# Patient Record
Sex: Male | Born: 1980
Health system: Southern US, Community
[De-identification: ages and names within clinical notes are randomized; demographics above are authoritative.]

## PROBLEM LIST (undated history)

## (undated) DIAGNOSIS — T7840XA Allergy, unspecified, initial encounter: Secondary | ICD-10-CM

## (undated) DIAGNOSIS — K760 Fatty (change of) liver, not elsewhere classified: Secondary | ICD-10-CM

## (undated) DIAGNOSIS — S0990XA Unspecified injury of head, initial encounter: Secondary | ICD-10-CM

## (undated) HISTORY — DX: Fatty (change of) liver, not elsewhere classified: K76.0

## (undated) HISTORY — DX: Unspecified injury of head, initial encounter: S09.90XA

## (undated) HISTORY — DX: Allergy, unspecified, initial encounter: T78.40XA

---

## 2000-05-04 DIAGNOSIS — S0990XA Unspecified injury of head, initial encounter: Secondary | ICD-10-CM

## 2000-05-04 HISTORY — DX: Unspecified injury of head, initial encounter: S09.90XA

## 2000-05-04 HISTORY — PX: EYE SURGERY: SHX253

## 2001-02-09 ENCOUNTER — Encounter: Payer: Self-pay | Admitting: Emergency Medicine

## 2001-02-10 ENCOUNTER — Encounter: Payer: Self-pay | Admitting: Emergency Medicine

## 2001-02-10 ENCOUNTER — Encounter: Payer: Self-pay | Admitting: General Surgery

## 2001-02-10 ENCOUNTER — Encounter: Payer: Self-pay | Admitting: Neurosurgery

## 2001-02-10 ENCOUNTER — Inpatient Hospital Stay (HOSPITAL_COMMUNITY): Admission: AC | Admit: 2001-02-10 | Discharge: 2001-02-26 | Payer: Self-pay

## 2001-02-11 ENCOUNTER — Encounter: Payer: Self-pay | Admitting: Neurosurgery

## 2001-02-11 ENCOUNTER — Encounter: Payer: Self-pay | Admitting: General Surgery

## 2001-02-12 ENCOUNTER — Encounter: Payer: Self-pay | Admitting: General Surgery

## 2001-02-12 ENCOUNTER — Encounter: Payer: Self-pay | Admitting: Neurosurgery

## 2001-02-14 ENCOUNTER — Encounter: Payer: Self-pay | Admitting: General Surgery

## 2001-02-15 ENCOUNTER — Encounter: Payer: Self-pay | Admitting: General Surgery

## 2001-02-16 ENCOUNTER — Encounter: Payer: Self-pay | Admitting: General Surgery

## 2001-02-18 ENCOUNTER — Encounter: Payer: Self-pay | Admitting: General Surgery

## 2001-02-20 ENCOUNTER — Encounter: Payer: Self-pay | Admitting: General Surgery

## 2001-02-21 ENCOUNTER — Encounter: Payer: Self-pay | Admitting: General Surgery

## 2001-02-23 ENCOUNTER — Encounter: Payer: Self-pay | Admitting: General Surgery

## 2001-02-25 ENCOUNTER — Encounter: Payer: Self-pay | Admitting: General Surgery

## 2001-03-11 ENCOUNTER — Encounter: Payer: Self-pay | Admitting: Specialist

## 2001-03-11 ENCOUNTER — Ambulatory Visit (HOSPITAL_COMMUNITY): Admission: RE | Admit: 2001-03-11 | Discharge: 2001-03-12 | Payer: Self-pay | Admitting: Specialist

## 2002-05-06 IMAGING — XA IR ANGIO/VERTEBRAL*L*
1 series · 13 of 24 positions shown · non-contrast
Comparison: none

FINDINGS
CLINICAL DATA: FELL FROM [place].  EXTENSIVE BASILAR SKULL FRACTURES.  ASSESS FOR VASCULAR INJURY.
CEREBRAL ARTERIOGRAM
SINGLE WALL PUNCTURE WAS PERFORMED OF THE RIGHT COMMON FEMORAL ARTERY.  A 5.5 FRENCH FIERSTEIN
CATHETER WAS USED FOR THE EXAMINATION.  THE RIGHT COMMON CAROTID ARTERY, RIGHT VERTEBRAL ARTERY,
LEFT COMMON CAROTID ARTERY AND LEFT VERTEBRAL ARTERY WERE CATHETERIZED SELECTIVELY.
RIGHT COMMON CAROTID ARTERIOGRAM
THE VESSELS ARE NORMAL.  NO EVIDENCE OF INJURY AT THE SKULL BASE.  INTRACRANIAL CIRCULATION NORMAL.
RIGHT VERTEBRAL ARTERY ARTERIOGRAM
THE VESSEL IS WIDELY PATENT WITH NO EVIDENCE OF INJURY AT THE SKULL BASE.  INTRACRANIAL CIRCULATION
NORMAL.
LEFT COMMON CAROTID ARTERY ARTERIOGRAM
THE VESSELS APPEAR  NORMAL WITHOUT EVIDENCE OF INJURY AT THE SKULL BASE.  THE INTRACRANIAL
CIRCULATION IS NORMAL.
LEFT VERTEBRAL ARTERY ARTERIOGRAM
THE VESSEL IS WIDELY PATENT INTO THE BRAIN WITHOUT EVIDENCE OF INJURY AT THE SKULL BASE.  NO
INTRACRANIAL ABNORMALITY.
IMPRESSION
NEGATIVE EXAMINATION.  NO EVIDENCE OF VESSEL INJURY AT THE SKULL BASE.

[Series 1: run · 13 of 76 slices shown]
[im 1/76]
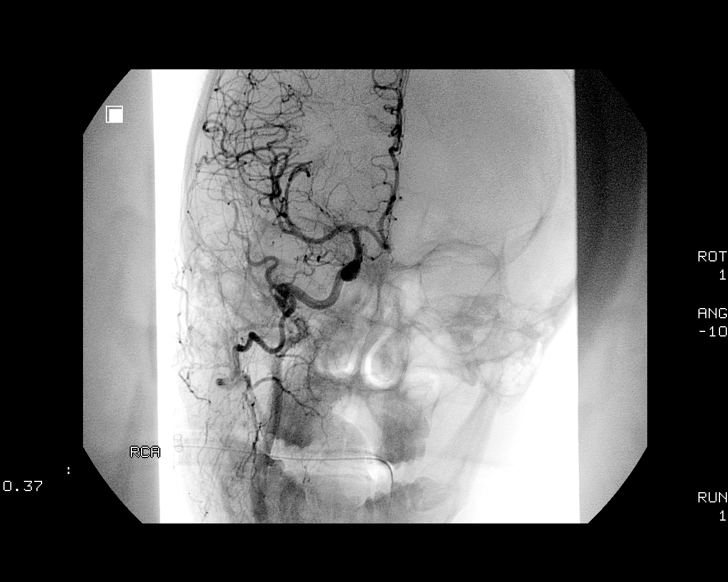
[im 7/76]
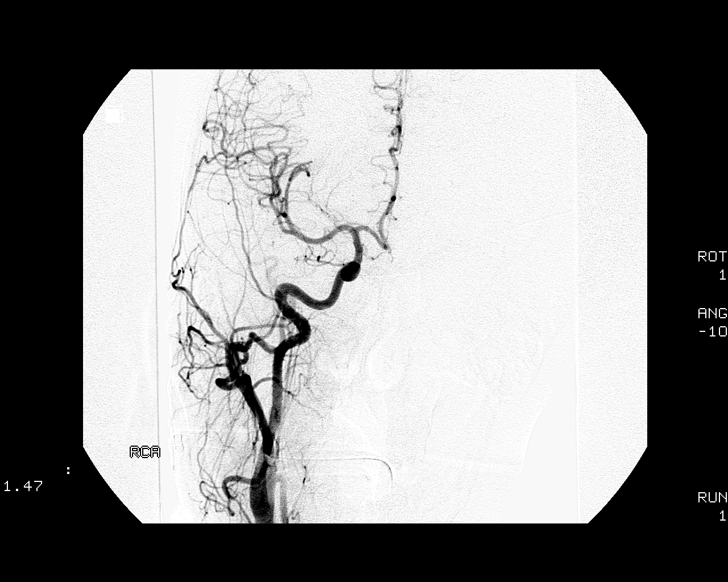
[im 14/76]
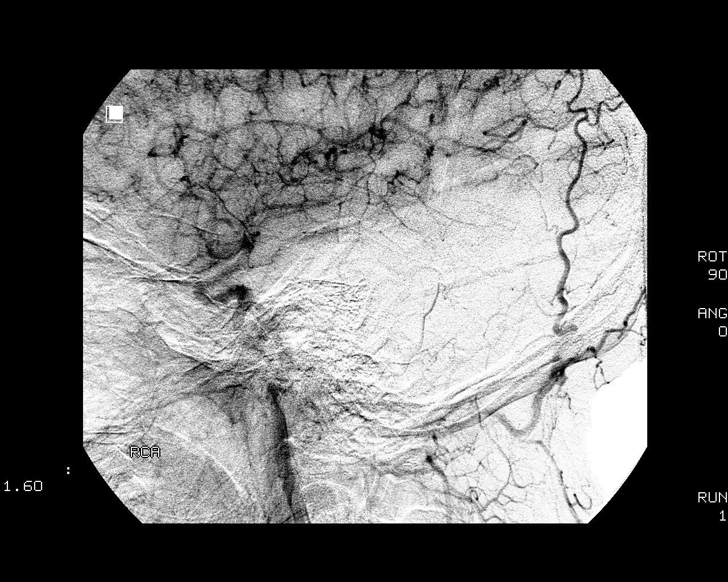
[im 20/76]
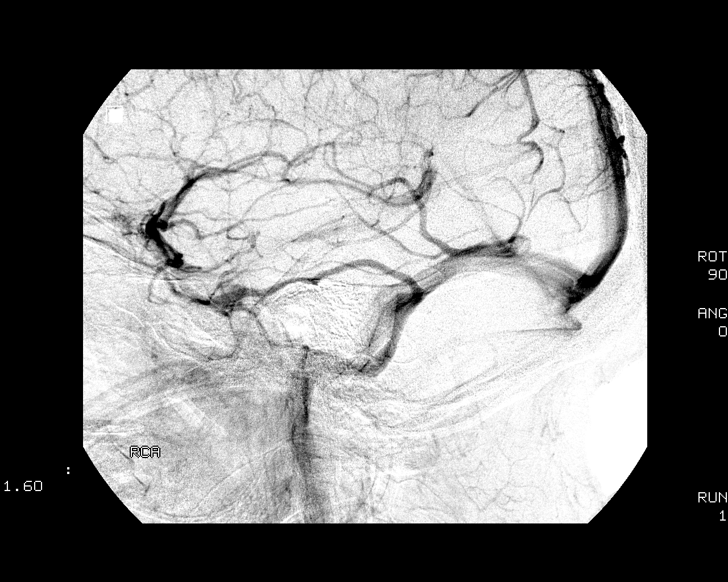
[im 27/76]
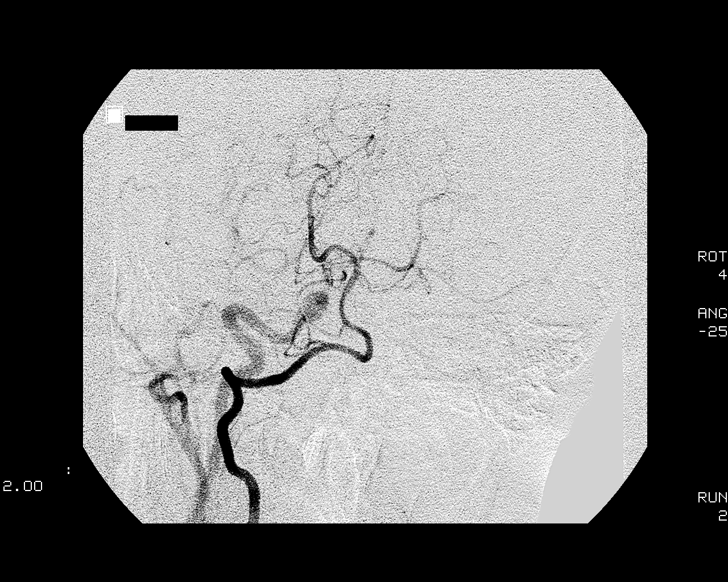
[im 33/76]
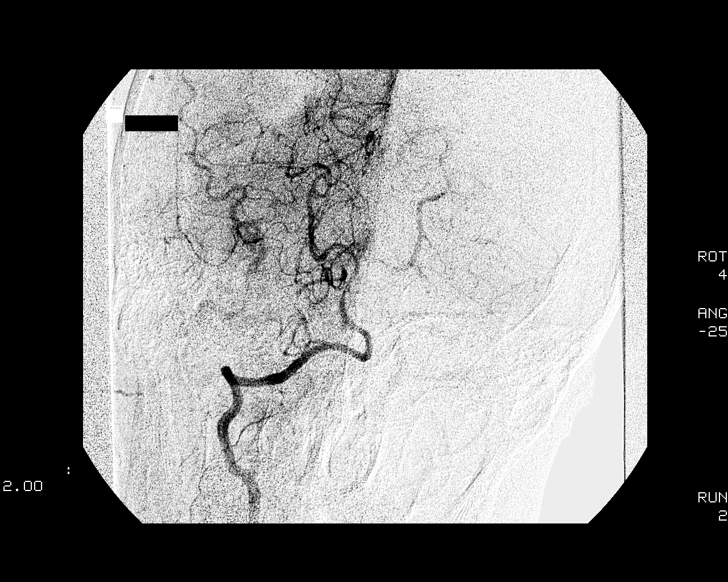
[im 40/76]
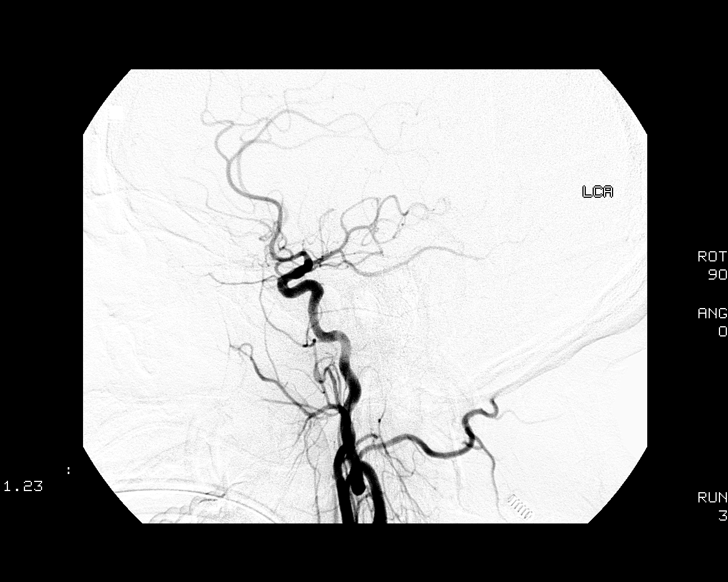
[im 43/76]
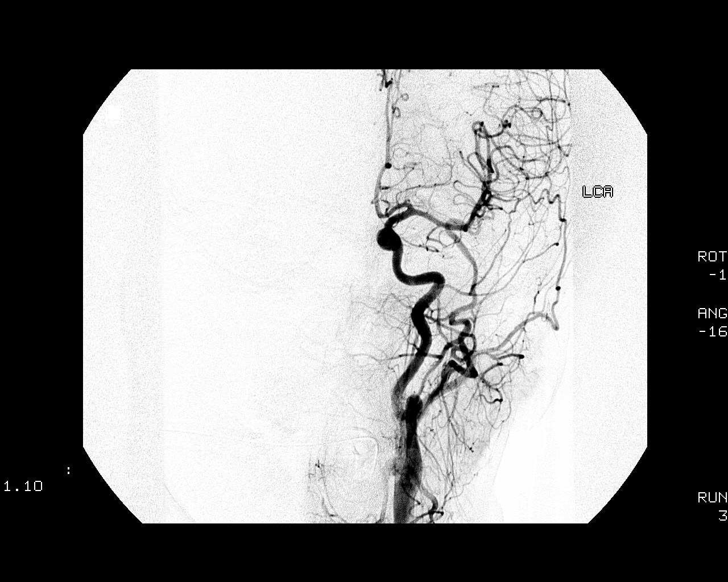
[im 49/76]
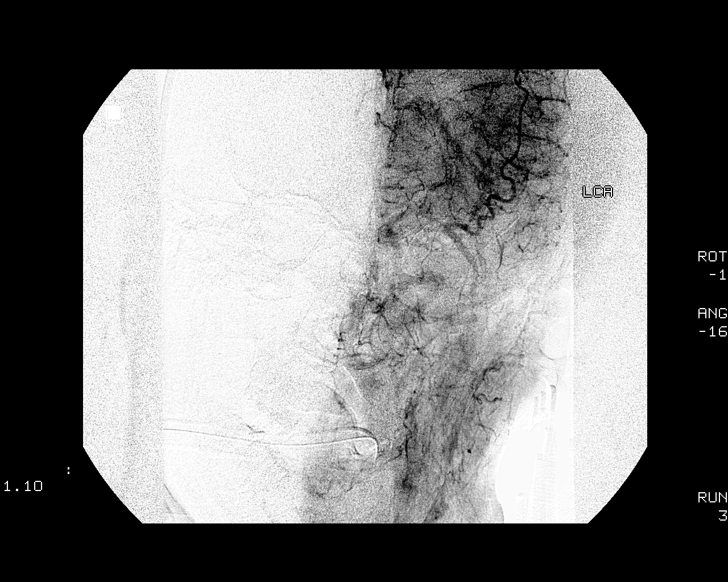
[im 56/76]
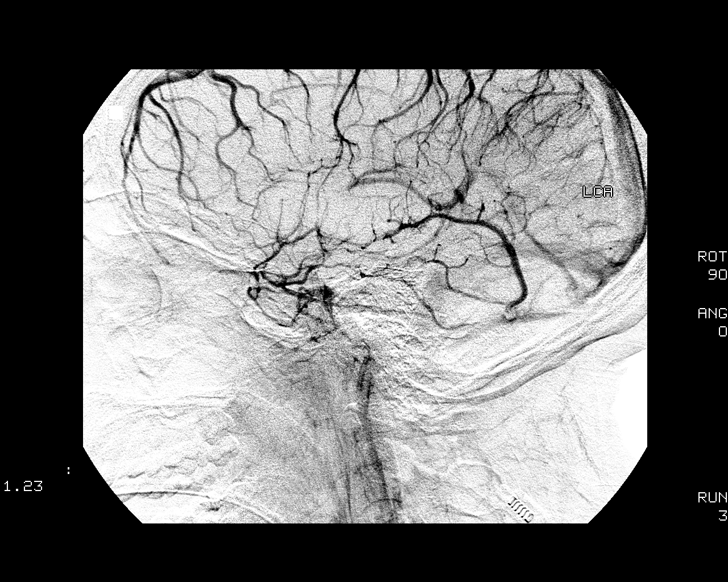
[im 62/76]
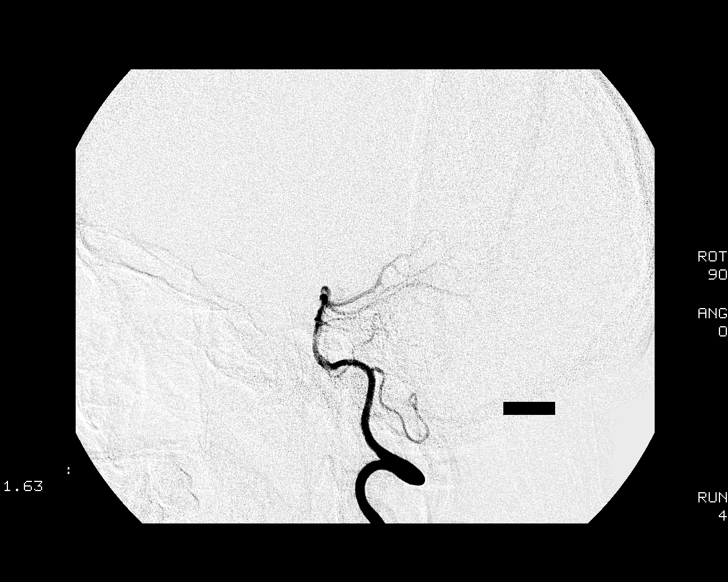
[im 69/76]
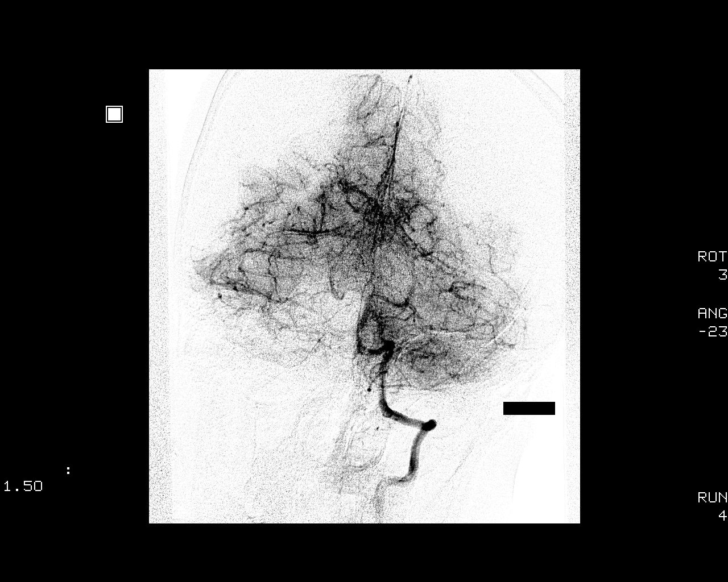
[im 76/76]
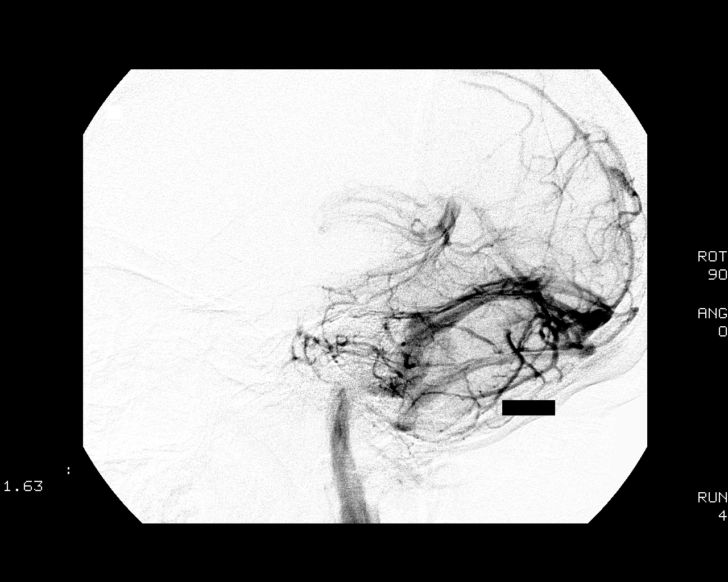

[13 of 24 positions shown; findings below may reference images not displayed]

## 2010-01-09 ENCOUNTER — Ambulatory Visit: Payer: Self-pay | Admitting: Internal Medicine

## 2010-01-09 DIAGNOSIS — K219 Gastro-esophageal reflux disease without esophagitis: Secondary | ICD-10-CM

## 2010-01-09 DIAGNOSIS — L230 Allergic contact dermatitis due to metals: Secondary | ICD-10-CM

## 2010-01-09 DIAGNOSIS — J45909 Unspecified asthma, uncomplicated: Secondary | ICD-10-CM | POA: Insufficient documentation

## 2010-01-11 ENCOUNTER — Encounter: Payer: Self-pay | Admitting: Internal Medicine

## 2010-02-04 ENCOUNTER — Ambulatory Visit: Payer: Self-pay | Admitting: Internal Medicine

## 2010-02-04 ENCOUNTER — Telehealth: Payer: Self-pay | Admitting: Internal Medicine

## 2010-02-06 ENCOUNTER — Encounter: Payer: Self-pay | Admitting: Internal Medicine

## 2010-03-18 ENCOUNTER — Ambulatory Visit: Payer: Self-pay | Admitting: Internal Medicine

## 2010-03-18 DIAGNOSIS — F430 Acute stress reaction: Secondary | ICD-10-CM | POA: Insufficient documentation

## 2010-03-21 ENCOUNTER — Telehealth: Payer: Self-pay | Admitting: Internal Medicine

## 2010-03-21 ENCOUNTER — Encounter: Payer: Self-pay | Admitting: Internal Medicine

## 2010-04-02 ENCOUNTER — Telehealth: Payer: Self-pay | Admitting: Family

## 2010-04-04 ENCOUNTER — Ambulatory Visit: Payer: Self-pay | Admitting: Internal Medicine

## 2010-04-11 ENCOUNTER — Ambulatory Visit: Payer: Self-pay | Admitting: Licensed Clinical Social Worker

## 2010-04-17 ENCOUNTER — Ambulatory Visit: Payer: Self-pay | Admitting: Internal Medicine

## 2010-04-18 ENCOUNTER — Ambulatory Visit: Payer: Self-pay | Admitting: Licensed Clinical Social Worker

## 2010-05-23 ENCOUNTER — Ambulatory Visit
Admission: RE | Admit: 2010-05-23 | Discharge: 2010-05-23 | Payer: Self-pay | Source: Home / Self Care | Attending: Licensed Clinical Social Worker | Admitting: Licensed Clinical Social Worker

## 2010-06-02 ENCOUNTER — Ambulatory Visit (INDEPENDENT_AMBULATORY_CARE_PROVIDER_SITE_OTHER)
Admission: RE | Admit: 2010-06-02 | Discharge: 2010-06-02 | Payer: BC Managed Care – PPO | Source: Home / Self Care | Attending: Internal Medicine | Admitting: Internal Medicine

## 2010-06-02 ENCOUNTER — Encounter: Payer: Self-pay | Admitting: Internal Medicine

## 2010-06-02 DIAGNOSIS — F411 Generalized anxiety disorder: Secondary | ICD-10-CM | POA: Insufficient documentation

## 2010-06-02 DIAGNOSIS — F430 Acute stress reaction: Secondary | ICD-10-CM

## 2010-06-02 LAB — CONVERTED CEMR LAB
ALT: 21 units/L (ref 0–53)
AST: 17 units/L (ref 0–37)
Albumin: 4.6 g/dL (ref 3.5–5.2)
Alkaline Phosphatase: 59 units/L (ref 39–117)
Bilirubin, Direct: 0.1 mg/dL (ref 0.0–0.3)
FSH: 3.5 milliintl units/mL (ref 1.4–18.1)
Free T4: 1.13 ng/dL (ref 0.80–1.80)
HCT: 45.8 % (ref 39.0–52.0)
Hemoglobin: 15.2 g/dL (ref 13.0–17.0)
Indirect Bilirubin: 0.3 mg/dL (ref 0.0–0.9)
LH: 5.9 milliintl units/mL (ref 1.5–9.3)
MCHC: 33.2 g/dL (ref 30.0–36.0)
MCV: 91.6 fL (ref 78.0–100.0)
Platelets: 246 10*3/uL (ref 150–400)
RBC: 5 M/uL (ref 4.22–5.81)
RDW: 12.9 % (ref 11.5–15.5)
TSH: 1.842 microintl units/mL (ref 0.350–4.500)
Total Bilirubin: 0.4 mg/dL (ref 0.3–1.2)
Total Protein: 7.3 g/dL (ref 6.0–8.3)
WBC: 6.6 10*3/uL (ref 4.0–10.5)

## 2010-06-03 NOTE — Letter (Signed)
Summary: Out of Work  Adult nurse at [employer]. [address]   [place], [place] [address]   Phone: [phone]  Fax: [phone]      March 21, 2010     Employee:  Abdulkadir Landfair    To Whom It May Concern:   For Medical reasons, please excuse the above named employee from work for the following dates:  Start:   03-18-10  End:   03-31-10   Pt may return to work on 04/01/10. If you need additional information, please feel free to contact our office.   Sincerely,    Mervin Kung CMA (AAMA) Thomos Lemons, DO

## 2010-06-03 NOTE — Letter (Signed)
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 If you need additional information, please feel free to contact our office.          Sincerely,    Glendell Docker CMA Dr Thomos Lemons

## 2010-06-03 NOTE — Assessment & Plan Note (Signed)
Summary: PPD Read  Nurse Visit   Primary Care Provider:  Dondra Spry DO  CC:  TB Skin test recheck.  History of Present Illness: The patient presented after 48 hours to check the site for positive or negative reaction.  Injection site examination: No firm bump  forms at the test site. Slightly reddish appearance and diameter was smaller than 5mm.  Assessment & Plan: Negative TB Skin test. Patient was counseled to call if exeprience any irritation of site.   Glendell Docker CMA  February 06, 2010 4:54 PM   CC: TB Skin test recheck   Allergies: No Known Drug Allergies  PPD Results    Date of reading: 02/06/2010    Results: < 5mm    Interpretation: negative

## 2010-06-03 NOTE — Progress Notes (Signed)
Summary: FMLA   Phone Note Other Incoming   Summary of Call: Received FMLA papers from [employer].  Spoke to pt., he states he is unable to return to work and is requesting completion of FMLA forms.  Advised pt he would need to re-evaluated. Appt scheduled for 04/04/10 @ 11am.  Mervin Kung CMA Duncan Dull)  April 02, 2010 4:23 PM

## 2010-06-03 NOTE — Assessment & Plan Note (Signed)
Summary: new pt est care/dt   Vital Signs:  Patient profile:   30 year old male Height:      69.5 inches Weight:      201 pounds BMI:     29.36 O2 Sat:      97 % on Room air Temp:     98.5 degrees F oral Pulse rate:   70 / minute Pulse rhythm:   regular Resp:     20 per minute BP sitting:   130 / 70  (left arm) Cuff size:   large  Vitals Entered By: Glendell Docker CMA (January 09, 2010 9:34 AM)  O2 Flow:  Room air CC: New Patient  Is Patient Diabetic? No Pain Assessment Patient in pain? no      Comments establish care, physical - non fasting, no previous pcp   Primary Care Provider:  Dondra Spry DO  CC:  New Patient .  History of Present Illness: 30 y/o AA male to establish and for routine cpx hx of severe head injury - fell 2 stories no headaches no hx of seizure d/o [employer]  wife and pt have been trying to have baby x 1 yr    Preventive Screening-Counseling & Management  Alcohol-Tobacco     Alcohol drinks/day: <1     Smoking Status: quit     Year Started: 2004     Year Quit: 2008     Cigars/week: 2 per week  Caffeine-Diet-Exercise     Caffeine use/day: 1 beverage daily     Does Patient Exercise: yes     Times/week: 3  Allergies (verified): No Known Drug Allergies  Past History:  Past Medical History: Asthma GERD  2002 - Hx of severe closed head injury -  fell off balcony landing on his head ( Dr. Shon Hough )   right supraorbital fx  right elbow fracture  right maxillary fracture  subdural hematoma  subarachnoid hemorrhage    Past Surgical History: Right eye socket repair due to accident-had a plate in place 1610   Family History: Family History Diabetes 1st degree relative Family History Hypertension    Social History: Occupation - works at [employer] Married 1 year  no children 2 foster children  GM - Chief of Staff ScottSmoking Status:  quit Caffeine use/day:  1 beverage daily Does Patient Exercise:  yes  Review of  Systems  The patient denies anorexia, fever, weight loss, weight gain, vision loss, chest pain, dyspnea on exertion, abdominal pain, melena, hematochezia, severe indigestion/heartburn, and depression.    Physical Exam  General:  alert, well-developed, and well-nourished.   Neck:  No deformities, masses, or tenderness noted. Lungs:  normal respiratory effort, normal breath sounds, no crackles, and no wheezes.   Heart:  normal rate, regular rhythm, no murmur, and no gallop.   Abdomen:  soft, non-tender, normal bowel sounds, no hepatomegaly, and no splenomegaly.   Extremities:  No lower extremity edema Neurologic:  cranial nerves II-XII intact and gait normal.   Skin:  eczematous patch lower abd near belt line Psych:  normally interactive, good eye contact, not anxious appearing, and not depressed appearing.     Impression & Recommendations:  Problem # 1:  PHYSICAL EXAMINATION (ICD-V70.0) Reviewed adult health maintenance protocols. Pt counseled on diet and exercise.  Orders: T-Basic Metabolic Panel [phone]) T-Lipid Profile [id number])  Td Booster: Historical (12/25/2008)   Flu Vax: Declined (01/09/2010)     Problem # 2:  CONTACT DERMATITIS DUE TO METALS (OZH-086.57) pt with  rash near where belt buckle contacts his skin.  we discussed avoidance measures.  use triamcinolone as directed  His updated medication list for this problem includes:    Loratadine 10 Mg Tabs (Loratadine) .Marland Kitchen... Take 1 tablet by mouth once a day    Triamcinolone Acetonide 0.1 % Crea (Triamcinolone acetonide) .Marland Kitchen... Apply two times a day  Complete Medication List: 1)  Loratadine 10 Mg Tabs (Loratadine) .... Take 1 tablet by mouth once a day 2)  Multivitamins Tabs (Multiple vitamin) .... Take 1 tablet by mouth once a day 3)  Fertility Blend For Men  .... Take 1 tablet by mouth once a day 4)  Triamcinolone Acetonide 0.1 % Crea (Triamcinolone acetonide) .... Apply two times a day  Patient  Instructions: 1)  Please schedule a follow-up appointment in 1 year. 2)  Please schedule a follow-up appointment as needed. 3)  Goal wt - 170 - 175 lbs Prescriptions: TRIAMCINOLONE ACETONIDE 0.1 % CREA (TRIAMCINOLONE ACETONIDE) apply two times a day  #15 grams x 1   Entered and Authorized by:   D. Thomos Lemons DO   Signed by:   D. Thomos Lemons DO on 01/09/2010   Method used:   Electronically to        [facility] (retail)       [address]       [place], [place]  [address]       Ph: [phone]       Fax: [phone]   RxID:   [id number]   Current Allergies (reviewed today): No known allergies    Preventive Care Screening  Last Tetanus Booster:    Date:  12/25/2008    Results:  Historical     Immunization History:  Influenza Immunization History:    Influenza:  declined (01/09/2010)   Contraindications/Deferment of Procedures/Staging:    Test/Procedure: FLU VAX    Reason for deferment: patient declined

## 2010-06-03 NOTE — Progress Notes (Signed)
Summary: Work excuse extended  Phone Note Call from Patient   Caller: Patient Details for Reason: Work excuse extended  Summary of Call: Pt called needs work note extended for  total of 7days for short term disability  benefits  pt was seen on 03/18/20 can be reach @   [phone] Initial call taken by: Darral Dash,  March 21, 2010 12:51 PM  Follow-up for Phone Call        left message on machine to call and let me know if he has been unable to work since he was seen on 11/15. Office note did not state that pt had been taken out of work.  Nicki Guadalajara Fergerson CMA Duncan Dull)  March 21, 2010 2:33 PM   Additional Follow-up for Phone Call Additional follow up Details #1::        Pt returned my call and states that he went back to work today. His supervisor told him he must be out of work 7 consecutive days for this to be covered. Pt is requesting to be placed out of work from 03/18/10 until 03/31/10.  Pt states this will give his employer time to determine if there is another department he can be transferred to and will be covered under his benefits. States he feels the same medically as he did on 03/18/10, no worse.  Pt would like [place] to call him when letter is complete.  Please advise. Nicki Guadalajara Fergerson CMA Duncan Dull)  March 21, 2010 3:54 PM     Additional Follow-up for Phone Call Additional follow up Details #2::    ok to extend work note as above Follow-up by: D. Thomos Lemons DO,  March 21, 2010 4:29 PM  Additional Follow-up for Phone Call Additional follow up Details #3:: Details for Additional Follow-up Action Taken: Work note completed and forwarded to Provider for signature. Nicki Guadalajara Fergerson CMA Duncan Dull)  March 21, 2010 4:38 PM   call placed to patient at [phone], he was informed extended work not approved, and would be left at front desk for pick up. Patient verbalized understanding and will pick note up  Glendell Docker [facility]  March 24, 2010 4:38 PM

## 2010-06-03 NOTE — Progress Notes (Signed)
Summary: Physical form for job  Phone Note Call from Patient Call back at [facility] Phone [phone]   Caller: Patient Summary of Call: pt checking to see if physical form is available, okay to leave detailed mess on phone per pt Initial call taken by: Lannette Donath,  February 04, 2010 10:39 AM  Follow-up for Phone Call        call was returned to patient [phone], no answer. A detailed voice was left informing patient that form has been completed and left at front desk for patient pick up. Copy sent to file Follow-up by: Glendell Docker CMA,  February 04, 2010 11:53 AM

## 2010-06-03 NOTE — Assessment & Plan Note (Signed)
Summary: stress at work/mhf   Vital Signs:  Patient profile:   30 year old male Weight:      201.50 pounds BMI:     29.44 O2 Sat:      97 % on Room air Temp:     98.4 degrees F oral Pulse rate:   80 / minute Resp:     18 per minute BP sitting:   132 / 80  (right arm) Cuff size:   large  Vitals Entered By: Glendell Docker CMA (March 18, 2010 1:36 PM)  O2 Flow:  Room air CC: Anxiety  Is Patient Diabetic? No Pain Assessment Patient in pain? no      Comments discuss anxiety    Primary Care Provider:  Dondra Spry DO  CC:  Anxiety .  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   no prior hx of anxiety or depression  slight wt gain no change in appetite no sign sleep disturbance  no other stressors at home  no hx suggestive of ADD  Preventive Screening-Counseling & Management  Alcohol-Tobacco     Smoking Status: quit  Allergies (verified): No Known Drug Allergies  Past History:  Past Medical History: Asthma GERD  2002 - Hx of severe closed head injury -  fell off balcony landing on his head ( Dr. Shon Hough )   right supraorbital fx  right elbow fracture  right maxillary fracture   subdural hematoma  subarachnoid hemorrhage    Past Surgical History: Right eye socket repair due to accident-had a plate in place 4034    Family History: Family History Diabetes 1st degree relative Family History Hypertension     Social History: Occupation - works at [employer] Married 1 year  no children 2 foster children   GM - Ambulance person  Physical Exam  General:  alert, well-developed, and well-nourished.   Lungs:  normal respiratory effort, normal breath sounds, no crackles, and no wheezes.   Heart:  normal rate, regular rhythm, no murmur, and no gallop.   Psych:  normally interactive, good eye contact, not anxious appearing,  and not depressed appearing.     Impression & Recommendations:  Problem # 1:  STRESS REACTION, ACUTE (ICD-308.9) pt with difficulty managing work stress pt looking for different line of work trial of short term use of sertraline we discussed common side effects Patient advised to call office if symptoms  worsen.  Complete Medication List: 1)  Multivitamins Tabs (Multiple vitamin) .... Take 1 tablet by mouth once a day 2)  Fertility Blend For Men  .... Take 1 tablet by mouth once a day 3)  Sertraline Hcl 25 Mg Tabs (Sertraline hcl) .... 1/2 to one tab by mouth qd  Patient Instructions: 1)  Please schedule a follow-up appointment in 1 month. 2)  Call our office if your symptoms do not  improve or gets worse. Prescriptions: SERTRALINE HCL 25 MG TABS (SERTRALINE HCL) 1/2 to one tab by mouth qd  #30 x 1   Entered and Authorized by:   D. Thomos Lemons DO   Signed by:   D. Thomos Lemons DO on 03/18/2010   Method used:   Electronically to        [facility] (retail)       [address]       [place]  [place], [place]  [address]       Ph: [phone]       Fax: [phone]   RxID:   [id number]    Orders Added: 1)  Est. Patient Level III [84132]     Current Allergies (reviewed today): No known allergies

## 2010-06-03 NOTE — Assessment & Plan Note (Signed)
Summary: 4:30 tb skin test/dt  Nurse Visit   Allergies: No Known Drug Allergies  Immunizations Administered:  PPD Skin Test:    Vaccine Type: PPD    Site: left forearm    Mfr: Sanofi Pasteur    Dose: 0.1 ml    Route: ID    Given by: Mervin Kung CMA (AAMA)    Exp. Date: 03/06/2011    Lot #: [id number]  Orders Added: 1)  TB Skin Test [86580] 2)  Admin 1st Vaccine [90471]   CC: Pt here for PPD placement to complete medical evaluation form for [facility]. Pt will return on Thursday for PPD reading. Completed form will be given to pt at that time.

## 2010-06-03 NOTE — Letter (Signed)
Summary: Medical Eval [facility]  Medical Eval [facility]   Imported By: Lanelle Bal 02/13/2010 12:49:22  _____________________________________________________________________  External Attachment:    Type:   Image     Comment:   External Document

## 2010-06-05 NOTE — Assessment & Plan Note (Signed)
Summary: needs FMLA papers completed / tf,cma   Vital Signs:  Patient profile:   30 year old male Height:      69.5 inches Weight:      202 pounds BMI:     29.51 O2 Sat:      98 % on Room air Temp:     98.5 degrees F oral Pulse rate:   61 / minute Resp:     16 per minute BP sitting:   120 / 80  (left arm) Cuff size:   large  Vitals Entered By: Glendell Docker CMA (April 04, 2010 11:26 AM)  O2 Flow:  Room air CC: Stress  Is Patient Diabetic? No Pain Assessment Patient in pain? no      Comments discuss time off from work no change in status from previous visit-to discuss with Dr Artist Pais   Primary Care Provider:  D. Thomos Lemons DO  CC:  Stress .  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 Shon Hough )   right supraorbital fx  right elbow fracture  right maxillary fracture    subdural hematoma  subarachnoid hemorrhage    Past Surgical History: Right eye socket repair due to accident-had a plate in place 1610     Physical Exam  General:  alert, well-developed, and well-nourished.   Lungs:  normal respiratory effort, normal breath sounds, no crackles, and no wheezes.   Heart:  normal rate, regular rhythm, no murmur, and no gallop.   Psych:  normally interactive, good eye contact, not anxious appearing, and not depressed appearing.     Impression & Recommendations:  Problem # 1:  STRESS REACTION, ACUTE (ICD-308.9) pt unable to deal with work stress refer to  counselor pt in process of looking for new occupation  Orders: Psychology Referral (Psychology)  Complete Medication List: 1)  Multivitamins Tabs (Multiple vitamin) .... Take 1 tablet by mouth once a day 2)  Fertility Blend For Men  .... Take 1 tablet by mouth once a day 3)  Sertraline Hcl 25 Mg Tabs (Sertraline hcl) .... 1/2 to one tab by mouth qd  Patient Instructions: 1)  Please schedule a follow-up appointment as needed.   Orders Added: 1)  Psychology Referral [Psychology] 2)  Est. Patient Level III [96045]    Current Allergies (reviewed today): No known allergies

## 2010-06-06 ENCOUNTER — Encounter: Payer: Self-pay | Admitting: Internal Medicine

## 2010-06-11 NOTE — Letter (Signed)
   [place] at [facility] [address] [place], [place]  [address]  [place] Phone: [phone]      June 06, 2010   [facility] [address] [place], [place] [address]  RE:  LAB RESULTS  Dear  Mr. JABLONSKY,  The following is an interpretation of your most recent lab tests.  Please take note of any instructions provided or changes to medications that have resulted from your lab work.  LIVER FUNCTION TESTS:  Good - no changes needed  THYROID STUDIES:  Thyroid studies normal TSH: 1.842     CBC:  Good - no changes needed  Hormone levels - normal       Sincerely Yours,    Dr. Thomos Lemons  Appended Document:  mailed

## 2010-06-25 NOTE — Assessment & Plan Note (Signed)
Summary: Return to work ?  hea   Vital Signs:  Patient profile:   30 year old male Height:      69.5 inches Weight:      201.25 pounds BMI:     29.40 O2 Sat:      100 % on Room air Temp:     98.1 degrees F oral Pulse rate:   61 / minute Resp:     16 per minute BP sitting:   120 / 70  (left arm) Cuff size:   large  Vitals Entered By: Glendell Docker CMA (June 02, 2010 9:15 AM)  O2 Flow:  Room air CC: Discuss short term disability Is Patient Diabetic? No Pain Assessment Patient in pain? no        Primary Care Provider:  Dondra Spry DO  CC:  Discuss short term disability.  History of Present Illness: 30 y/o male prev seen for stress rxn for f/u in the process of going back to work  has been out of work Nov 19th to present  had therapy session with  [name] (behavioral health)      Preventive Screening-Counseling & Management  Alcohol-Tobacco     Smoking Status: quit  Allergies: No Known Drug Allergies  Past History:  Past Medical History: Asthma GERD  2002 - Hx of severe closed head injury -  fell off balcony landing on his head ( Dr. Shon Hough )   right supraorbital fx   right elbow fracture  right maxillary fracture    subdural hematoma  subarachnoid hemorrhage    Family History: Family History Diabetes 1st degree relative Family History Hypertension      Social History: Occupation - works at [employer] Married 1 year  no children 2 foster children    GM - Ambulance person  Physical Exam  General:  alert, well-developed, and well-nourished.   Lungs:  normal respiratory effort and normal breath sounds.   Heart:  normal rate, regular rhythm, and no gallop.   Neurologic:  cranial nerves II-XII intact and gait normal.   Psych:  normally interactive, good eye contact, not anxious appearing, and not depressed appearing.     Impression & Recommendations:  Problem # 1:  STRESS REACTION, ACUTE (ICD-308.9) Assessment Improved pt  has hx of traumatic brain injury check pituitary function  pt seen by behavioral health he wants to try to return to prev job function he defers use of SSRI  Orders: T-Hepatic Function [phone]) T-CBC No Diff ([id number]) T-TSH ([id number]) T-T4, Free [phone]) T-FSH [phone]) T-LH [phone]) T- * Misc. Laboratory test [phone])  Complete Medication List: 1)  Multivitamins Tabs (Multiple vitamin) .... Take 1 tablet by mouth once a day 2)  Fertility Blend For Men  .... Take 1 tablet by mouth once a day  Patient Instructions: 1)  Please schedule a follow-up appointment in 2 months. 2)  Our office will contact you re:  blood test results   Orders Added: 1)  T-Hepatic Function [[id number]] 2)  T-CBC No Diff [[id number]] 3)  T-TSH [[id number]] 4)  T-T4, Free [[id number]] 5)  T-FSH [83001-23670] 6)  T-LH [83002-23680] 7)  T- * Misc. Laboratory test [99999] 8)  New Patient Level III [phone]

## 2010-07-16 ENCOUNTER — Encounter: Payer: Self-pay | Admitting: Internal Medicine

## 2010-07-31 ENCOUNTER — Ambulatory Visit (INDEPENDENT_AMBULATORY_CARE_PROVIDER_SITE_OTHER): Payer: BC Managed Care – PPO | Admitting: Internal Medicine

## 2010-07-31 ENCOUNTER — Encounter: Payer: Self-pay | Admitting: Internal Medicine

## 2010-07-31 VITALS — BP 116/70 | HR 53 | Temp 98.0°F | Resp 16 | Ht 69.5 in | Wt 199.0 lb

## 2010-07-31 DIAGNOSIS — F430 Acute stress reaction: Secondary | ICD-10-CM

## 2010-07-31 NOTE — Progress Notes (Signed)
  Subjective:    Patient ID: Gerald Norman, male    DOB: [birth year], 30 y.o.   MRN: [id number]  HPI 30 y/o male for follow up re:  Stress rxn / anxiety. 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    Review of Systems     Objective:   Physical Exam  Psychiatric: He has a normal mood and affect. His behavior is normal.          Assessment & Plan:

## 2010-07-31 NOTE — Assessment & Plan Note (Addendum)
Pt advised to continue to follow up with therapist - [name].  Pt advised to seek another line of work. If unable, we discussed trial of another SSRI citalopram (tried sertraline in the past)

## 2010-08-21 ENCOUNTER — Telehealth: Payer: Self-pay | Admitting: Internal Medicine

## 2010-08-21 NOTE — Telephone Encounter (Signed)
Please advise if ok to refill cream requested below.

## 2010-08-21 NOTE — Telephone Encounter (Signed)
Ok to refill x 1  

## 2010-08-21 NOTE — Telephone Encounter (Signed)
Refill- triamcinolon 0.1% cre. Apply two times a day. Qty 15. Last fill 11.17.11

## 2010-08-22 MED ORDER — TRIAMCINOLONE ACETONIDE 0.1 % EX CREA: TOPICAL_CREAM | Freq: Two times a day (BID) | CUTANEOUS | Status: DC

## 2010-08-22 NOTE — Telephone Encounter (Signed)
Refill sent to pharmacy.   

## 2010-09-19 NOTE — Op Note (Signed)
[place]. [facility]  Patient:    Gerald Norman, Gerald Norman Visit Number: [id number] MRN: [id number]          Service Type: DSU Location: [id number] Attending Physician:  Gustavus Messing Dictated by:   Yaakov Guthrie. Shon Hough, M.D. Proc. Date: 03/11/01 Admit Date:  03/11/2001 Discharge Date: 03/12/2001   CC:         [name], M.D. (2 copies)             Payton Doughty, M.D.                           Operative Report  SURGEON:  [name], M.D.  ASSISTANT:  Margaretha Sheffield.  FIRST ASSISTANT:  R.N.  INDICATIONS:  This 30 year old fell several week ago, with sustaining severe closed head injuries as well as fractures of his facial area.  The patient was in the intensive care unit for over two weeks and the multiple fracture of the face, most nondisplaced, but has one in the right supraorbital area that transcends through the supraorbital area through to the forehead frontalis bone and is unstable.  The patient has been cleared by neurosurgery for open reduction and internal fixation.  Dr. Trey Sailors of neurosurgery is on standby in case there is neurosurgical intervention necessary.  OPERATION:  Exploration of right supraorbital fracture, reduction and fixation using the KLS miniplate system, the 1.0 plate number [place] [id number], screw is [id number], 4-in-2 screws.  DESCRIPTION OF PROCEDURE:  The patient was taken to the operating room, placed on the operating room table in the supine position and was given general anesthesia and intubated orally.  Prep was done to the face and neck areas using a Betadine soap solution, walled off with sterile towels and drapes subsequently to make a sterile field.  Then 1% Xylocaine with epinephrine was injected locally at the right brow area.  A transbrow incision was made through skin and subcutaneous tissue, underlying periosteum.  Using the [facility] I was able to dissect the periosteum superiorly and  inferiorly, protecting the orbital structures.  There was an incomplete fracture of the medial supraorbital area, but laterally there was a through-and-through fracture with the lateral portion being depressed approximately 7 cm.  On palpation this was unstable.  We were able to use the [facility] to elevate this area up and approximate to the outer portion of the bone.  After doing so and pushing in, we were able to fixate the area with KLS plate x 2, #[id number] and 7 mm miniscrews after making preliminary holes with the [employer] drill bit.  This fit very nicely with good fixation. The wound was irrigated with copious amounts of saline.  We were able to look at the area.  There was no evidence of any leakage or fluid from the area. The periosteum was then reclosed with 5-0 Vicryl, subcutaneous tissue, and and muscle with 5-0 Vicryl, and then a running subcuticular stitch of 5-0 Vicryl throughout the area.  Steri-Strips and soft dressings were applied to all the areas.  He withstood the procedure very well and was taken to recovery in good condition. Dictated by:   Yaakov Guthrie. Shon Hough, M.D. Attending Physician:  Gustavus Messing DD:  03/11/01 TD:  03/12/01 Job: [id number]

## 2010-09-19 NOTE — Discharge Summary (Signed)
[place]. [facility]  Patient:    Gerald Norman, Gerald Norman Visit Number: [id number] MRN: [id number]          Service Type: DSU Location: [id number] Attending Physician:  Gustavus Messing Dictated by:   Eugenia Pancoast, P.A. Admit Date:  03/11/2001 Discharge Date: 03/12/2001                             Discharge Summary  DATE OF BIRTH:  1981/03/23  CONSULTATIONS: 1. Dr. Trey Sailors. 2. Dr. Shon Hough.  FINAL DIAGNOSES: 1. Severe blunt head trauma. 2. Skull fracture. 3. Right elbow fracture. 4. Right maxillary sinus fracture. 5. Subdural hematoma. 6. Subarachnoid hemorrhage. 7. Staph pneumonia.  PROCEDURES:  Bronchoscopy.  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  He was brought to [facility] Emergency Room.  He had positive loss of consciousness.  He was combative.  He had a Glasgow coma scale of approximately 6.  Subsequently, he was intubated.  A full workup was done in the emergency room.  The CT of the head showed a subarachnoid bleed and subdural hematoma.  Also noted a fracture of the right orbit and right maxillary sinus.  The patient was subsequently hospitalized.  Dr. Trey Sailors was consulted, Dr. Shon Hough was consulted.  They both saw the patient.  The patient had an ICP monitor inserted per Dr. Channing Mutters.  He was seen by Dr. Shon Hough, and he noted multiple fractures and followed him while he was in the hospital.  Prior to discharge, discussion of outpatient surgery for repair of these fractures was discussed and appointments were made.  Prior to that, the patient had been hospitalized, he was intubated.  While intubated he developed a Staph pneumonia which was treated successfully.  He remained on the vent until 10/20, at which time he was finally extubated.  Prior to that, he was unable to be extubated due to respiratory failure.  He had  feeding tube in place, orogastric tube.  By 10/20, he finally began to arouse at that point.  He showed great improvement.  He had a ______ study which he passed, and was started on a pureed diet.  He improved rapidly from this point on.  He initially was thought to be possibly a rehabilitation candidate, but with his improvement noted he was discharged finally to home rather then going to rehab.  This was performed on 02/26/01.  The patients ICP monitor had been discharged approximately 02/20/01.  After that point, as noted, he did well, he cognitively was appropriate.  Subsequently, he was discharged home on 02/26/01, in satisfactory stable condition.  He was to follow up with Dr. Trey Sailors and Dr. Shon Hough as appointed for repair of the other fractures.  He may follow up with trauma as needed.  He was subsequently discharged home in satisfactory stable condition on 02/26/01. Dictated by:   Eugenia Pancoast, P.A. Attending Physician:  Gustavus Messing DD:  04/06/01 TD:  04/06/01 Job: [id number]

## 2010-09-19 NOTE — Consult Note (Signed)
[place]. [facility]  Patient:    Gerald Norman, Gerald Norman Visit Number: [id number] MRN: [id number]          Service Type: TRA Location: [id number] Attending Physician:  Trauma, Md Dictated by:   Payton Doughty, M.D. Proc. Date: 02/10/01 Admit Date:  02/09/2001                            Consultation Report  REFERRING PHYSICIAN:  Angelia Mould. Derrell Lolling, M.D.  I was called to see this 30 year old right-handed black gentleman who was intoxicated and fell 20-25 feet off a balcony landing on his head.  He was drunk and had positive loss of consciousness.  Was transferred by EMS to [facility] where he was combative, but answering simple questions.  He was sedated ______ and intubated.  CT showed a small frontal epidural on the right side, a left subarachnoid intraparenchymal spotty hemorrhage, fracture of right frontal and ethmoid and sphenoid bone extending down through the clivus and foramen magnum, question of right occipital condylar fracture.  CT of the C spine and plain spine films were negative.  PAST MEDICAL HISTORY:  According to his brother, benign.  PAST SURGICAL HISTORY:  According to his brother, benign.  ALLERGIES:  No known drug allergies.  FAMILY HISTORY:  Not known.  SOCIAL HISTORY:  Obviously drinks.  We do not know if he smokes or not.  He is a Consulting civil engineer at [employer].  PHYSICAL EXAMINATION  GENERAL:  Done by Angelia Mould. Derrell Lolling, M.D.  HEENT:  Swelling of the face and the orbits, right greater than left, boggy scalp.  He does not have any CSF at the nares and there is no hemotympanum.  NECK:  Without deformity.  No signs of deformity palpable.  NEUROLOGIC:  He is sedated and intubated with ______ sedation allowed to lighten up a little bit.  He has some spontaneous extremity movements which appear to be purposeful.  His pupils are 6 mm with slight reaction.  His gaze is conjugate and he has a positive dolls, positive corneals.  Positive cough and positive gag.   Withdraws all extremities.  LABORATORIES:  CT results have been reviewed above.  ASSESSMENT:  Severe closed head injury.  Glasgow coma scale:  Eyes:  On initial presentation he would open to stimulation is a III;  verbal:  Because he would speak and answer simple questions was IV; and motor examination was purposeful so it was a IV, total of XI.  Because of the skull based injuries, I would favor a carotid vertebral angiography.  He needs to have a CT of the head repeated in several hours to make sure the epidural is stable.  He will also require an ICP monitoring and maxillofacial to see him. Dictated by:   Payton Doughty, M.D. Attending Physician:  Trauma, Md DD:  02/10/01 TD:  02/10/01 Job: [id number]

## 2010-11-14 ENCOUNTER — Encounter: Payer: Self-pay | Admitting: Family

## 2010-11-14 ENCOUNTER — Ambulatory Visit (INDEPENDENT_AMBULATORY_CARE_PROVIDER_SITE_OTHER): Payer: BC Managed Care – PPO | Admitting: Family

## 2010-11-14 DIAGNOSIS — B354 Tinea corporis: Secondary | ICD-10-CM | POA: Insufficient documentation

## 2010-11-14 DIAGNOSIS — L259 Unspecified contact dermatitis, unspecified cause: Secondary | ICD-10-CM

## 2010-11-14 DIAGNOSIS — L309 Dermatitis, unspecified: Secondary | ICD-10-CM

## 2010-11-14 MED ORDER — TRIAMCINOLONE ACETONIDE 0.025 % EX OINT: TOPICAL_OINTMENT | Freq: Two times a day (BID) | CUTANEOUS | Status: DC

## 2010-11-14 NOTE — Progress Notes (Signed)
  Subjective:    Patient ID: Gerald Norman, male    DOB: 1980/12/23, 30 y.o.   MRN: [id number]  HPI Gerald Norman is a 30 year old male who presents today with chief complaint of rash.  Has rash in the suprapubic, left lateral abdomen, behind bilateral ears and on the left side of neck.  Notes that this is puritic at times.  He reports that the symptoms have improved in the past with use of kenolog cream.  Took about a week to clear.    Review of Systems See history of present illness  Past Medical History  Diagnosis Date  . Asthma   . GERD (gastroesophageal reflux disease)   . Closed head injury 2002    severe closed head injury--fell off balcony landing on head (Dr Shon Hough)  Rt supraorbital fx, rt elbow fx, rt maxillary fx, subdural hematoma, subarachnoid hemorrhage    History   Social History  . Marital Status: Married    Spouse Name: N/A    Number of Children: 0  . Years of Education: N/A   Occupational History  . Not on file.   Social History Main Topics  . Smoking status: Former Games developer  . Smokeless tobacco: Not on file  . Alcohol Use: Not on file  . Drug Use: Not on file  . Sexually Active: Not on file   Other Topics Concern  . Not on file   Social History Narrative   Works at [employer]. Has 2 foster children. No children of his own.Gerald Norman    Past Surgical History  Procedure Date  . Eye surgery 2002    Rt. eye socket repair due to accident--had a plate in place    Family History  Problem Relation Age of Onset  . Diabetes    . Hypertension      No Known Allergies  Current Outpatient Prescriptions on File Prior to Visit  Medication Sig Dispense Refill  . Multiple Vitamin (MULTIVITAMIN) tablet Take 1 tablet by mouth daily.          BP 116/74  Pulse 78  Temp(Src) 97.8 F (36.6 C) (Oral)  Resp 16  Ht 5' 9.5" (1.765 m)  Wt 208 lb 1.9 oz (94.403 kg)  BMI 30.29 kg/m2       Objective:   Physical Exam General: Pleasant male,  awake, alert, and in no acute distress Skin: Papular rash on a erythematous/hyperpigmented base approximately 2-3 inches in diameter located beneath the umbilicus. A smaller patch noted on left lateral abdomen, behind both ears, and on left side of neck. Psychiatric patient is calm, alert, and appropriate, A and O x4       Assessment & Plan:

## 2010-11-14 NOTE — Patient Instructions (Signed)
Follow up in 1 month, sooner if symptoms worsen or do not improve.

## 2010-11-14 NOTE — Assessment & Plan Note (Signed)
He reports that this rash has responded well in the past to use the Kenalog cream. We will give him another trial of Kenalog. He is instructed to follow up in one month, sooner if symptoms worsen. He verbalizes understanding

## 2010-12-10 ENCOUNTER — Ambulatory Visit: Payer: BC Managed Care – PPO | Admitting: Family

## 2010-12-16 ENCOUNTER — Encounter: Payer: Self-pay | Admitting: Family

## 2010-12-16 ENCOUNTER — Ambulatory Visit (INDEPENDENT_AMBULATORY_CARE_PROVIDER_SITE_OTHER): Payer: BC Managed Care – PPO | Admitting: Family

## 2010-12-16 VITALS — BP 124/88 | HR 60 | Temp 98.2°F | Ht 69.5 in | Wt 206.0 lb

## 2010-12-16 DIAGNOSIS — B354 Tinea corporis: Secondary | ICD-10-CM

## 2010-12-16 DIAGNOSIS — Z5181 Encounter for therapeutic drug level monitoring: Secondary | ICD-10-CM

## 2010-12-16 LAB — HEPATIC FUNCTION PANEL
Alkaline Phosphatase: 54 U/L (ref 39–117)
Indirect Bilirubin: 0.4 mg/dL (ref 0.0–0.9)
Total Bilirubin: 0.5 mg/dL (ref 0.3–1.2)

## 2010-12-16 LAB — BASIC METABOLIC PANEL
Calcium: 9.6 mg/dL (ref 8.4–10.5)
Sodium: 139 mEq/L (ref 135–145)

## 2010-12-16 MED ORDER — TERBINAFINE HCL 250 MG PO TABS: 250.0000 mg | ORAL_TABLET | Freq: Every day | ORAL | Status: DC

## 2010-12-16 NOTE — Progress Notes (Signed)
  Subjective:    Patient ID: Gerald Norman, male    DOB: Apr 29, 1981, 30 y.o.   MRN: [id number]  HPI  [name] is a 30 yr old male who presents for f/u today of his skin rash.  He reports that the area of his lower stomach remains discolored and pruritic at times.  He continues the kenalog cream on a daily basis.  Has stopped wearing his metal belt which seems to have improved his discomfort some. He has some additional patches which are irritated on his skin.      Review of Systems See HPI  Past Medical History  Diagnosis Date  . Asthma   . GERD (gastroesophageal reflux disease)   . Closed head injury 2002    severe closed head injury--fell off balcony landing on head (Dr Shon Hough)  Rt supraorbital fx, rt elbow fx, rt maxillary fx, subdural hematoma, subarachnoid hemorrhage    History   Social History  . Marital Status: Married    Spouse Name: N/A    Number of Children: 0  . Years of Education: N/A   Occupational History  . Not on file.   Social History Main Topics  . Smoking status: Former Games developer  . Smokeless tobacco: Never Used  . Alcohol Use: Yes     2-3 times a week  . Drug Use: No  . Sexually Active: Not on file   Other Topics Concern  . Not on file   Social History Narrative   Works at [employer]. Has 2 foster children. No children of his own.[name]    Past Surgical History  Procedure Date  . Eye surgery 2002    Rt. eye socket repair due to accident--had a plate in place    Family History  Problem Relation Age of Onset  . Diabetes    . Hypertension      No Known Allergies  Current Outpatient Prescriptions on File Prior to Visit  Medication Sig Dispense Refill  . Multiple Vitamin (MULTIVITAMIN) tablet Take 1 tablet by mouth daily.        Marland Kitchen triamcinolone (KENALOG) 0.025 % ointment Apply topically 2 (two) times daily.  30 g  1    BP 124/88  Pulse 60  Temp(Src) 98.2 F (36.8 C) (Oral)  Ht 5' 9.5" (1.765 m)  Wt 206 lb  0.6 oz (93.459 kg)  BMI 29.99 kg/m2       Objective:   Physical Exam  Constitutional: He appears well-developed and well-nourished.  Skin:       Raised, hyperpigmented rash on lower abdomen.  Raised papular/hyperpigmented rash noted on left lateral abdomen, left neck and right upper arm.          Assessment & Plan:

## 2010-12-16 NOTE — Patient Instructions (Addendum)
Please follow up in 1 month, sooner if symptoms worsen.

## 2010-12-16 NOTE — Assessment & Plan Note (Signed)
I suspect that his rash is more of a tinea infection than an eczema.  Will treat with Lamisil.  If resolved at 2 weeks, I have advised pt to call me.  Otherwise will continue x 4 weeks until his f/u visit.  Check baseline Cr and LFT's today.

## 2010-12-19 ENCOUNTER — Encounter: Payer: Self-pay | Admitting: Family

## 2011-01-08 ENCOUNTER — Encounter: Payer: Self-pay | Admitting: Internal Medicine

## 2011-01-20 ENCOUNTER — Ambulatory Visit: Payer: BC Managed Care – PPO | Admitting: Family

## 2011-01-27 ENCOUNTER — Ambulatory Visit (INDEPENDENT_AMBULATORY_CARE_PROVIDER_SITE_OTHER): Payer: BC Managed Care – PPO | Admitting: Family

## 2011-01-27 ENCOUNTER — Encounter: Payer: Self-pay | Admitting: Family

## 2011-01-27 VITALS — BP 114/84 | HR 60 | Temp 98.1°F | Resp 16 | Ht 69.5 in | Wt 208.0 lb

## 2011-01-27 DIAGNOSIS — Z23 Encounter for immunization: Secondary | ICD-10-CM

## 2011-01-27 DIAGNOSIS — B354 Tinea corporis: Secondary | ICD-10-CM

## 2011-01-27 MED ORDER — CLOTRIMAZOLE-BETAMETHASONE 1-0.05 % EX LOTN: TOPICAL_LOTION | Freq: Two times a day (BID) | CUTANEOUS | Status: DC

## 2011-01-27 NOTE — Progress Notes (Signed)
Addended by: Mervin Kung A on: 01/27/2011 02:42 PM   Modules accepted: Orders

## 2011-01-27 NOTE — Patient Instructions (Signed)
Please call if rash worsens or if it does not continue to improve.

## 2011-01-27 NOTE — Assessment & Plan Note (Signed)
This is improving.  Will transition to lotrisone PRN.  He will let us know if rash does not continue to fade.

## 2011-01-27 NOTE — Progress Notes (Signed)
  Subjective:    Patient ID: EVONTE PRESTAGE, male    DOB: 1980/12/20, 30 y.o.   MRN: [id number]  HPI Mr.  Bacchi is a 30 yr old male who presents today for follow up of his rash.  He has been taking lamisil for the last 1 month and notes improvement in the rash.  He reports that the rash is no longer pruritic.   Review of Systems See HPI  Past Medical History  Diagnosis Date  . Asthma   . GERD (gastroesophageal reflux disease)   . Closed head injury 2002    severe closed head injury--fell off balcony landing on head (Dr Shon Hough)  Rt supraorbital fx, rt elbow fx, rt maxillary fx, subdural hematoma, subarachnoid hemorrhage    History   Social History  . Marital Status: Married    Spouse Name: N/A    Number of Children: 0  . Years of Education: N/A   Occupational History  . Not on file.   Social History Main Topics  . Smoking status: Former Games developer  . Smokeless tobacco: Never Used  . Alcohol Use: Yes     2-3 times a week  . Drug Use: No  . Sexually Active: Not on file   Other Topics Concern  . Not on file   Social History Narrative   Works at [employer]. Has 2 foster children. No children of his own.[name]    Past Surgical History  Procedure Date  . Eye surgery 2002    Rt. eye socket repair due to accident--had a plate in place    Family History  Problem Relation Age of Onset  . Diabetes    . Hypertension      No Known Allergies  Current Outpatient Prescriptions on File Prior to Visit  Medication Sig Dispense Refill  . loratadine (CLARITIN) 10 MG tablet Take 10 mg by mouth daily.        . Multiple Vitamin (MULTIVITAMIN) tablet Take 1 tablet by mouth daily.        Marland Kitchen terbinafine (LAMISIL) 250 MG tablet Take 1 tablet (250 mg total) by mouth daily.  60 tablet  0  . triamcinolone (KENALOG) 0.025 % ointment Apply topically 2 (two) times daily.  30 g  1    BP 114/84  Pulse 60  Temp(Src) 98.1 F (36.7 C) (Oral)  Resp 16  Ht 5' 9.5"  (1.765 m)  Wt 208 lb (94.348 kg)  BMI 30.28 kg/m2       Objective:   Physical Exam  Constitutional: He appears well-developed and well-nourished. No distress.  Skin: Skin is warm and dry.       Hyperpigmented rash on right arm and left lateral abdomen is faded and nearly resolved.  Rash beneath umbilicus is also fading but still present.  Psychiatric: He has a normal mood and affect. His behavior is normal. Judgment and thought content normal.          Assessment & Plan:

## 2011-02-10 ENCOUNTER — Encounter: Payer: BC Managed Care – PPO | Admitting: Internal Medicine

## 2011-10-07 ENCOUNTER — Ambulatory Visit (INDEPENDENT_AMBULATORY_CARE_PROVIDER_SITE_OTHER): Payer: Managed Care, Other (non HMO) | Admitting: Family

## 2011-10-07 ENCOUNTER — Encounter: Payer: Self-pay | Admitting: Family

## 2011-10-07 VITALS — BP 104/60 | HR 60 | Temp 98.0°F | Resp 18

## 2011-10-07 DIAGNOSIS — B354 Tinea corporis: Secondary | ICD-10-CM

## 2011-10-07 DIAGNOSIS — J309 Allergic rhinitis, unspecified: Secondary | ICD-10-CM | POA: Insufficient documentation

## 2011-10-07 DIAGNOSIS — R21 Rash and other nonspecific skin eruption: Secondary | ICD-10-CM

## 2011-10-07 MED ORDER — FLUTICASONE PROPIONATE 50 MCG/ACT NA SUSP: 2.0000 | Freq: Every day | NASAL | Status: DC

## 2011-10-07 MED ORDER — CLOTRIMAZOLE-BETAMETHASONE 1-0.05 % EX LOTN: TOPICAL_LOTION | Freq: Two times a day (BID) | CUTANEOUS | Status: AC

## 2011-10-07 MED ORDER — FLUCONAZOLE 150 MG PO TABS: ORAL_TABLET | ORAL | Status: DC

## 2011-10-07 MED ORDER — LORATADINE-PSEUDOEPHEDRINE ER 5-120 MG PO TB12: 1.0000 | ORAL_TABLET | Freq: Two times a day (BID) | ORAL | Status: DC

## 2011-10-07 NOTE — Patient Instructions (Signed)
Allergic Rhinitis  Allergic rhinitis is when the mucous membranes in the nose respond to allergens. Allergens are particles in the air that cause your body to have an allergic reaction. This causes you to release allergic antibodies. Through a chain of events, these eventually cause you to release histamine into the blood stream (hence the use of antihistamines). Although meant to be protective to the body, it is this release that causes your discomfort, such as frequent sneezing, congestion and an itchy runny nose.    CAUSES    The pollen allergens may come from grasses, trees, and weeds. This is seasonal allergic rhinitis, or "hay fever." Other allergens cause year-round allergic rhinitis (perennial allergic rhinitis) such as house dust mite allergen, pet dander and mold spores.    SYMPTOMS     Nasal stuffiness (congestion).   Runny, itchy nose with sneezing and tearing of the eyes.   There is often an itching of the mouth, eyes and ears.  It cannot be cured, but it can be controlled with medications.  DIAGNOSIS    If you are unable to determine the offending allergen, skin or blood testing may find it.  TREATMENT     Avoid the allergen.   Medications and allergy shots (immunotherapy) can help.   Hay fever may often be treated with antihistamines in pill or nasal spray forms. Antihistamines block the effects of histamine. There are over-the-counter medicines that may help with nasal congestion and swelling around the eyes. Check with your caregiver before taking or giving this medicine.  If the treatment above does not work, there are many new medications your caregiver can prescribe. Stronger medications may be used if initial measures are ineffective. Desensitizing injections can be used if medications and avoidance fails. Desensitization is when a patient is given ongoing shots until the body becomes less sensitive to the allergen. Make sure you follow up with your caregiver if problems continue.  SEEK  MEDICAL CARE IF:     You develop fever (more than 100.5 F (38.1 C).   You develop a cough that does not stop easily (persistent).   You have shortness of breath.   You start wheezing.   Symptoms interfere with normal daily activities.  Document Released: 01/13/2001 Document Revised: 04/09/2011 Document Reviewed: 07/25/2008  [employer] Patient Information 2012 [employer].

## 2011-10-07 NOTE — Progress Notes (Signed)
  Subjective:    Patient ID: Gerald Norman, male    DOB: 1981/03/19, 31 y.o.   MRN: [id number]  HPI  Nasal congestion- clear.  Started to act up recently.  He cuts grass on the side.  He is using generic claritin without improvement.  + eye itching.    Dermatitis- pt reports that the rash beneath the beltline has returned.  It did improve previously with the use of lotrisone cream.  Review of Systems see HPI    Past Medical History  Diagnosis Date  . Asthma   . GERD (gastroesophageal reflux disease)   . Closed head injury 2002    severe closed head injury--fell off balcony landing on head (Dr Shon Hough)  Rt supraorbital fx, rt elbow fx, rt maxillary fx, subdural hematoma, subarachnoid hemorrhage    History   Social History  . Marital Status: Married    Spouse Name: N/A    Number of Children: 0  . Years of Education: N/A   Occupational History  . Not on file.   Social History Main Topics  . Smoking status: Former Games developer  . Smokeless tobacco: Never Used  . Alcohol Use: Yes     2-3 times a week  . Drug Use: No  . Sexually Active: Not on file   Other Topics Concern  . Not on file   Social History Narrative   Works at [employer]. Has 2 foster children. No children of his own.[name]    Past Surgical History  Procedure Date  . Eye surgery 2002    Rt. eye socket repair due to accident--had a plate in place    Family History  Problem Relation Age of Onset  . Diabetes    . Hypertension      No Known Allergies  Current Outpatient Prescriptions on File Prior to Visit  Medication Sig Dispense Refill  . loratadine (CLARITIN) 10 MG tablet Take 10 mg by mouth daily.        . Multiple Vitamin (MULTIVITAMIN) tablet Take 1 tablet by mouth daily.        . fluticasone (FLONASE) 50 MCG/ACT nasal spray Place 2 sprays into the nose daily.  16 g  6    BP 104/60  Pulse 60  Temp(Src) 98 F (36.7 C) (Oral)  Resp 18  SpO2 98%    Objective:   Physical Exam  Constitutional: He appears well-developed and well-nourished. No distress.  HENT:  Head: Normocephalic and atraumatic.  Right Ear: Tympanic membrane and ear canal normal.  Left Ear: Tympanic membrane and ear canal normal.  Mouth/Throat: No posterior oropharyngeal edema or posterior oropharyngeal erythema.  Cardiovascular: Normal rate and regular rhythm.   No murmur heard. Pulmonary/Chest: Effort normal and breath sounds normal. No respiratory distress. He has no wheezes. He has no rales. He exhibits no tenderness.  Skin:       Hyperpigmented rash noted on lower abdomen.   Psychiatric: He has a normal mood and affect. His speech is normal and behavior is normal. Judgment and thought content normal. Cognition and memory are normal.          Assessment & Plan:

## 2011-10-07 NOTE — Assessment & Plan Note (Signed)
Contact dermatitis versus fungal.  Will rx with lotrisone cream and trial of fluconazole.

## 2011-10-07 NOTE — Assessment & Plan Note (Signed)
Change claritin to claritin D.  Add flonase.  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

## 2012-04-19 ENCOUNTER — Ambulatory Visit (INDEPENDENT_AMBULATORY_CARE_PROVIDER_SITE_OTHER): Payer: Managed Care, Other (non HMO) | Admitting: Family

## 2012-04-19 ENCOUNTER — Encounter: Payer: Self-pay | Admitting: Family

## 2012-04-19 VITALS — BP 122/74 | HR 71 | Temp 98.1°F | Resp 18 | Ht 70.5 in | Wt 214.1 lb

## 2012-04-19 DIAGNOSIS — Z0000 Encounter for general adult medical examination without abnormal findings: Secondary | ICD-10-CM | POA: Insufficient documentation

## 2012-04-19 DIAGNOSIS — Z111 Encounter for screening for respiratory tuberculosis: Secondary | ICD-10-CM

## 2012-04-19 NOTE — Patient Instructions (Addendum)
Please return on Thursday for PPD reading. Complete fasting blood work on Thursday. Follow up for physical in 1 year, sooner if problems/concerns.

## 2012-04-19 NOTE — Assessment & Plan Note (Addendum)
Pt encouraged on exercise and weight loss.  PPD placed today.  He left prior to receiving flu shot.  Will plan to give on Thursday. He will also return for fasting lab work on Thursday.

## 2012-04-19 NOTE — Progress Notes (Signed)
Subjective:    Patient ID: Gerald Norman, male    DOB: [birth year], 31 y.o.   MRN: [id number]  HPI  Mr. Gerald Norman is a 31 yr old male who presents today for cpx. He has paperwork for foster parenting that he would like completed.   Immunizations: Wants flu shot, Td up to date. Diet: He reports that he is eating healthy, balancing his diet. Tries to eat lots of fruits/veggies. Exercise: Reports not working out as much.      Review of Systems  Constitutional: Negative for unexpected weight change.  HENT: Negative for hearing loss and congestion.   Eyes: Negative for visual disturbance.  Respiratory: Negative for cough.   Cardiovascular: Negative for leg swelling.  Gastrointestinal: Negative for nausea, vomiting and diarrhea.  Genitourinary: Negative for dysuria and frequency.  Musculoskeletal: Negative for myalgias and arthralgias.  Skin: Negative for rash.  Neurological: Negative for headaches.  Hematological: Negative for adenopathy.  Psychiatric/Behavioral:       Denies current depression/anxiety   Past Medical History  Diagnosis Date  . Asthma   . GERD (gastroesophageal reflux disease)   . Closed head injury 2002    severe closed head injury--fell off balcony landing on head (Dr Shon Hough)  Rt supraorbital fx, rt elbow fx, rt maxillary fx, subdural hematoma, subarachnoid hemorrhage    History   Social History  . Marital Status: Married    Spouse Name: N/A    Number of Children: 0  . Years of Education: N/A   Occupational History  . Not on file.   Social History Main Topics  . Smoking status: Former Games developer  . Smokeless tobacco: Never Used  . Alcohol Use: Yes     Comment: 2-3 times a week  . Drug Use: No  . Sexually Active: Not on file   Other Topics Concern  . Not on file   Social History Narrative   Works at [employer]. Has 2 foster children. No children of his own.[name]    Past Surgical History  Procedure Date  . Eye surgery  2002    Rt. eye socket repair due to accident--had a plate in place    Family History  Problem Relation Age of Onset  . Diabetes    . Hypertension      No Known Allergies  Current Outpatient Prescriptions on File Prior to Visit  Medication Sig Dispense Refill  . clotrimazole-betamethasone (LOTRISONE) lotion Apply topically 2 (two) times daily.  30 mL  1  . fluticasone (FLONASE) 50 MCG/ACT nasal spray Place 2 sprays into the nose daily.  16 g  6  . loratadine-pseudoephedrine (CLARITIN-D 12 HOUR) 5-120 MG per tablet Take 1 tablet by mouth 2 (two) times daily.  60 tablet  5  . Multiple Vitamin (MULTIVITAMIN) tablet Take 1 tablet by mouth daily.          BP 122/74  Pulse 71  Temp 98.1 F (36.7 C) (Oral)  Resp 18  Ht 5' 10.5" (1.791 m)  Wt 214 lb 1.3 oz (97.106 kg)  BMI 30.28 kg/m2  SpO2 99%       Objective:   Physical Exam  Physical Exam  Constitutional: He is oriented to person, place, and time. He appears well-developed and well-nourished. No distress.  HENT:  Head: Normocephalic and atraumatic.  Right Ear: Tympanic membrane and ear canal normal.  Left Ear: Tympanic membrane and ear canal normal.  Mouth/Throat: Oropharynx is clear and moist.  Eyes: Pupils are equal, round, and reactive  to light. No scleral icterus.  Neck: Normal range of motion. No thyromegaly present.  Cardiovascular: Normal rate and regular rhythm.   No murmur heard. Pulmonary/Chest: Effort normal and breath sounds normal. No respiratory distress. He has no wheezes. He has no rales. He exhibits no tenderness.  Abdominal: Soft. Bowel sounds are normal. He exhibits no distension and no mass. There is no tenderness. There is no rebound and no guarding.  Musculoskeletal: He exhibits no edema.  Lymphadenopathy:    He has no cervical adenopathy.  Neurological: He is alert and oriented to person, place, and time. He has normal patellar reflexes. He exhibits normal muscle tone. Coordination normal.  Skin:  Skin is warm and dry.  Psychiatric: He has a normal mood and affect. His behavior is normal. Judgment and thought content normal.          Assessment & Plan:          Assessment & Plan:

## 2012-04-19 NOTE — Addendum Note (Signed)
Addended by: Mervin Kung A on: 04/19/2012 02:35 PM   Modules accepted: Orders

## 2012-04-21 ENCOUNTER — Ambulatory Visit (INDEPENDENT_AMBULATORY_CARE_PROVIDER_SITE_OTHER): Payer: Managed Care, Other (non HMO) | Admitting: Family

## 2012-04-21 DIAGNOSIS — Z23 Encounter for immunization: Secondary | ICD-10-CM

## 2012-04-21 LAB — CBC WITH DIFFERENTIAL/PLATELET
Basophils Relative: 1 % (ref 0–1)
Eosinophils Absolute: 0.3 10*3/uL (ref 0.0–0.7)
Eosinophils Relative: 4 % (ref 0–5)
Hemoglobin: 15.5 g/dL (ref 13.0–17.0)
Lymphs Abs: 2.5 10*3/uL (ref 0.7–4.0)
MCH: 30.3 pg (ref 26.0–34.0)
MCHC: 34.9 g/dL (ref 30.0–36.0)
MCV: 86.9 fL (ref 78.0–100.0)
Monocytes Relative: 6 % (ref 3–12)
Neutrophils Relative %: 56 % (ref 43–77)
RBC: 5.11 MIL/uL (ref 4.22–5.81)

## 2012-04-21 LAB — BASIC METABOLIC PANEL
BUN: 15 mg/dL (ref 6–23)
CO2: 28 mEq/L (ref 19–32)
Chloride: 103 mEq/L (ref 96–112)
Creat: 1.01 mg/dL (ref 0.50–1.35)
Potassium: 4.6 mEq/L (ref 3.5–5.3)

## 2012-04-21 LAB — LIPID PANEL
HDL: 51 mg/dL (ref >39)
LDL Cholesterol: 77 mg/dL (ref 0–99)
Total CHOL/HDL Ratio: 2.8 Ratio
VLDL: 17 mg/dL (ref 0–40)

## 2012-04-21 LAB — HEPATIC FUNCTION PANEL
AST: 30 U/L (ref 0–37)
Albumin: 4.8 g/dL (ref 3.5–5.2)
Bilirubin, Direct: 0.1 mg/dL (ref 0.0–0.3)
Total Bilirubin: 0.5 mg/dL (ref 0.3–1.2)

## 2012-04-21 LAB — TSH: TSH: 2.004 u[IU]/mL (ref 0.350–4.500)

## 2012-04-21 LAB — TB SKIN TEST

## 2012-04-21 NOTE — Progress Notes (Signed)
  Subjective:    Patient ID: Gerald Norman, male    DOB: February 12, 1981, 31 y.o.   MRN: [id number]  HPI  Patient returned to the office after 48 hours for assessment of PPD skin test placed on 04/19/12.  Review of Systems     Objective:   Physical Exam  Slight redness less than 5mm and no induration noted at injection site.      Assessment & Plan:   Negative PPD skin test.

## 2012-04-21 NOTE — Addendum Note (Signed)
Addended by: Regis Bill on: 04/21/2012 05:25 PM   Modules accepted: Orders

## 2012-04-22 ENCOUNTER — Telehealth: Payer: Self-pay | Admitting: *Deleted

## 2012-04-22 LAB — URINALYSIS, ROUTINE W REFLEX MICROSCOPIC
Glucose, UA: NEGATIVE mg/dL
Hgb urine dipstick: NEGATIVE
Ketones, ur: NEGATIVE mg/dL
Leukocytes, UA: NEGATIVE
Specific Gravity, Urine: >1.03 — ABNORMAL HIGH (ref 1.005–1.030)
pH: 6 (ref 5.0–8.0)

## 2012-04-22 NOTE — Telephone Encounter (Signed)
Notified pt of instructions below. Pt voices understanding. Future lab order placed and given to the lab. Pt was provided with lab hours.

## 2012-04-22 NOTE — Telephone Encounter (Signed)
Message copied by Kathi Simpers on Fri Apr 22, 2012  4:37 PM ------      Message from: O'SULLIVAN, MELISSA      Created: Fri Apr 22, 2012 12:56 PM       Pls call pt and let him know labs look good.  One LFT mildly elevated, I would like him to repeat lft's in 1 month dx (elevated LFT's)

## 2012-05-25 LAB — HEPATIC FUNCTION PANEL
AST: 33 U/L (ref 0–37)
Albumin: 4.6 g/dL (ref 3.5–5.2)
Alkaline Phosphatase: 57 U/L (ref 39–117)
Bilirubin, Direct: 0.1 mg/dL (ref 0.0–0.3)
Indirect Bilirubin: 0.4 mg/dL (ref 0.0–0.9)
Total Bilirubin: 0.5 mg/dL (ref 0.3–1.2)

## 2012-05-25 NOTE — Addendum Note (Signed)
Addended by: Mervin Kung A on: 05/25/2012 09:25 AM   Modules accepted: Orders

## 2012-05-25 NOTE — Telephone Encounter (Signed)
Pt presented to the lab and future order released. 

## 2012-05-26 ENCOUNTER — Telehealth: Payer: Self-pay | Admitting: Family

## 2012-05-26 DIAGNOSIS — R945 Abnormal results of liver function studies: Secondary | ICD-10-CM

## 2012-05-26 NOTE — Telephone Encounter (Signed)
Please let pt know that one of his LFT's remains mildly elevated.  I would like him to complete an abdominal US and some additional blood work to further evaluate. (pended below) Unlikely to be worrisome.  Most likely cause is fatty liver and the ultrasound will help [place] look for this.

## 2012-05-26 NOTE — Telephone Encounter (Signed)
Left message to return my call.  

## 2012-05-27 NOTE — Telephone Encounter (Signed)
Patient returned phone call. Best # [phone]

## 2012-05-27 NOTE — Telephone Encounter (Signed)
Notified pt and he voices understanding. He will come in for U/S on Tuesday at 9:30am then have blood work immediately following. Orders signed and copy given to the lab.

## 2012-05-27 NOTE — Telephone Encounter (Signed)
Left message for pt to callback office.  

## 2012-05-31 ENCOUNTER — Ambulatory Visit (HOSPITAL_BASED_OUTPATIENT_CLINIC_OR_DEPARTMENT_OTHER): Payer: Managed Care, Other (non HMO)

## 2012-06-02 ENCOUNTER — Ambulatory Visit (HOSPITAL_BASED_OUTPATIENT_CLINIC_OR_DEPARTMENT_OTHER): Payer: Managed Care, Other (non HMO)

## 2012-06-02 ENCOUNTER — Ambulatory Visit (HOSPITAL_BASED_OUTPATIENT_CLINIC_OR_DEPARTMENT_OTHER)
Admission: RE | Admit: 2012-06-02 | Discharge: 2012-06-02 | Disposition: A | Discharge status: Home / Self Care | Payer: Managed Care, Other (non HMO) | Source: Ambulatory Visit | Attending: Family | Admitting: Family

## 2012-06-02 DIAGNOSIS — R7989 Other specified abnormal findings of blood chemistry: Secondary | ICD-10-CM | POA: Insufficient documentation

## 2012-06-23 LAB — IRON AND TIBC: UIBC: 205 ug/dL (ref 125–400)

## 2012-06-24 LAB — HEPATITIS B SURFACE ANTIGEN: Hepatitis B Surface Ag: NEGATIVE

## 2012-06-27 ENCOUNTER — Encounter: Payer: Self-pay | Admitting: Family

## 2012-06-27 DIAGNOSIS — K760 Fatty (change of) liver, not elsewhere classified: Secondary | ICD-10-CM | POA: Insufficient documentation

## 2012-06-27 HISTORY — DX: Fatty (change of) liver, not elsewhere classified: K76.0

## 2012-09-28 ENCOUNTER — Other Ambulatory Visit: Payer: Self-pay | Admitting: Family

## 2012-10-26 ENCOUNTER — Other Ambulatory Visit: Payer: Self-pay | Admitting: Family

## 2012-12-29 ENCOUNTER — Other Ambulatory Visit: Payer: Self-pay | Admitting: Family

## 2013-03-01 ENCOUNTER — Ambulatory Visit (INDEPENDENT_AMBULATORY_CARE_PROVIDER_SITE_OTHER): Payer: Managed Care, Other (non HMO) | Admitting: Family

## 2013-03-01 ENCOUNTER — Encounter: Payer: Self-pay | Admitting: Family

## 2013-03-01 VITALS — BP 122/86 | HR 83 | Temp 99.6°F | Resp 16 | Ht 70.5 in | Wt 209.0 lb

## 2013-03-01 DIAGNOSIS — J029 Acute pharyngitis, unspecified: Secondary | ICD-10-CM

## 2013-03-01 LAB — POCT RAPID STREP A (OFFICE): Rapid Strep A Screen: NEGATIVE

## 2013-03-01 NOTE — Patient Instructions (Signed)
You may use ibuprofen 400mg  every 6 hours as needed for pain or chloraseptic spray. Call if symptoms worsen or if not improved in 2-3 days.

## 2013-03-01 NOTE — Addendum Note (Signed)
Addended by: Mervin Kung A on: 03/01/2013 02:07 PM   Modules accepted: Orders

## 2013-03-01 NOTE — Progress Notes (Signed)
Subjective:    Patient ID: Gerald Norman, male    DOB: 1980-12-11, 32 y.o.   MRN: [id number]  HPI Gerald Norman is a 32 y/o male who presents today for sore throat since Sunday. Patient reports difficulty speaking and swallowing. Patient reports dry, hacking non productive cough that began Tuesday night. Patient denies fever, has fever today in the office.  Patient has tried Nyquil, cough drops, hot tea which improve symptoms temporarily.  Denies nausea and vomiting, denies sinus pressure and headaches, denies ear pain.   Review of Systems  Constitutional: Negative for chills, appetite change and fatigue.  HENT: Positive for sore throat.        Reports since Sunday night.  Respiratory: Positive for cough. Negative for shortness of breath.        Non productive dry, hacking cough since Tuesday night.  Cardiovascular: Negative for chest pain.  Gastrointestinal: Negative for nausea, vomiting and diarrhea.  Genitourinary: Negative for dysuria.   Past Medical History  Diagnosis Date  . Asthma   . GERD (gastroesophageal reflux disease)   . Closed head injury 2002    severe closed head injury--fell off balcony landing on head (Dr Shon Hough)  Rt supraorbital fx, rt elbow fx, rt maxillary fx, subdural hematoma, subarachnoid hemorrhage  . Fatty liver 06/27/2012    History   Social History  . Marital Status: Married    Spouse Name: N/A    Number of Children: 0  . Years of Education: N/A   Occupational History  . Not on file.   Social History Main Topics  . Smoking status: Former Games developer  . Smokeless tobacco: Never Used  . Alcohol Use: Yes     Comment: 2-3 times a week  . Drug Use: No  . Sexual Activity: Not on file   Other Topics Concern  . Not on file   Social History Narrative   Works at [employer]. Has 2 foster children. No children of his own.   [name]          Past Surgical History  Procedure Laterality Date  . Eye surgery  2002    Rt. eye socket  repair due to accident--had a plate in place    Family History  Problem Relation Age of Onset  . Diabetes    . Hypertension      No Known Allergies  Current Outpatient Prescriptions on File Prior to Visit  Medication Sig Dispense Refill  . ALAVERT ALLERGY/SINUS 5-120 MG per tablet TAKE 1 TABLET BY MOUTH 2 (TWO) TIMES DAILY.  60 tablet  5  . fluticasone (FLONASE) 50 MCG/ACT nasal spray PLACE 2 SPRAYS INTO THE NOSE DAILY.  16 g  3  . Multiple Vitamin (MULTIVITAMIN) tablet Take 1 tablet by mouth daily.         No current facility-administered medications on file prior to visit.    BP 122/86  Pulse 83  Temp(Src) 99.6 F (37.6 C) (Oral)  Resp 16  Ht 5' 10.5" (1.791 m)  Wt 209 lb 0.6 oz (94.82 kg)  BMI 29.56 kg/m2  SpO2 99%       Objective:   Physical Exam  Constitutional: He is oriented to person, place, and time. He appears well-nourished.  HENT:  Head: Normocephalic and atraumatic.  Mouth/Throat: Mucous membranes are normal. Mucous membranes are not pale and not dry. Posterior oropharyngeal erythema present. No oropharyngeal exudate.  Eyes: Pupils are equal, round, and reactive to light.  Cardiovascular: Normal rate, regular rhythm and  normal heart sounds.   Pulmonary/Chest: Effort normal and breath sounds normal. No respiratory distress. He has no wheezes.  Neurological: He is alert and oriented to person, place, and time.  Skin: Skin is warm and dry.  Psychiatric: He has a normal mood and affect.          Assessment & Plan:

## 2013-03-01 NOTE — Assessment & Plan Note (Signed)
Symptoms most consistent with viral pharyngitis.  Rapid strep negative. Will send strep to lab to confirm. Advised supportive measures as outlined in VS

## 2013-03-02 LAB — STREP A DNA PROBE: GASP: NEGATIVE

## 2013-03-06 ENCOUNTER — Encounter: Payer: Self-pay | Admitting: Family

## 2013-03-08 ENCOUNTER — Telehealth: Payer: Self-pay | Admitting: Family

## 2013-03-08 NOTE — Telephone Encounter (Signed)
Received message from pt stating his employer sent the wrong paperwork. They were supposed to send FMLA form to cover his recent absence that we saw him for. Left message for pt to contact employer and request they send correct papers if he hasn't already. Awaiting forms.

## 2013-03-08 NOTE — Telephone Encounter (Signed)
Received paperwork for short term disability for pt.  Left message requesting reason for Short term disability request.

## 2013-03-09 ENCOUNTER — Other Ambulatory Visit: Payer: Self-pay

## 2013-03-09 NOTE — Telephone Encounter (Signed)
Patient called in stating to disregard the paperwork that we have already received. He says that he will bring in the correct paperwork by sometime this afternoon or tomorrow.

## 2013-03-10 NOTE — Telephone Encounter (Signed)
Paperwork received and forwarded to Provider for completion.

## 2013-03-14 ENCOUNTER — Telehealth: Payer: Self-pay

## 2013-03-14 NOTE — Telephone Encounter (Signed)
Patient was left a message informing him that his FMLA is ready and that we need a number to send it to or he could come and pick the form up. Waiting for patient to call back.

## 2013-03-14 NOTE — Telephone Encounter (Signed)
Paper work completed.

## 2013-03-15 NOTE — Telephone Encounter (Signed)
Pt left message on 03/14/13 that he would like FMLA papers faxed to [phone] and he would also like to pick up a copy.  Forms faxed and copy left at front desk for pick up. Left detailed message on cell# re: completion and $25 form fee and to call if any questions.

## 2013-08-26 IMAGING — US US ABDOMEN COMPLETE
1 series · 13 of 25 positions shown · non-contrast
Comparison: None.

CLINICAL DATA: Elevated LFTs.  Evaluate for hepatic steatosis

COMPLETE ABDOMINAL ULTRASOUND

[Series 1: us abdomen complete · 0.33mm/px · 13 of 61 slices shown]
[im 1/61]
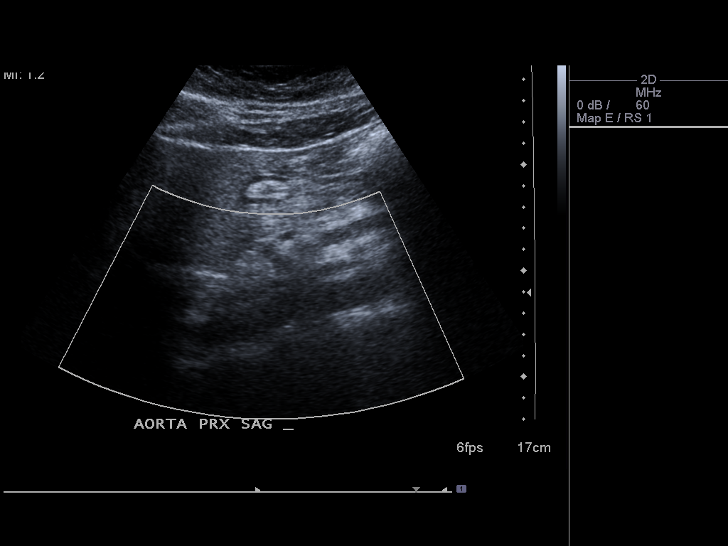
[im 6/61]
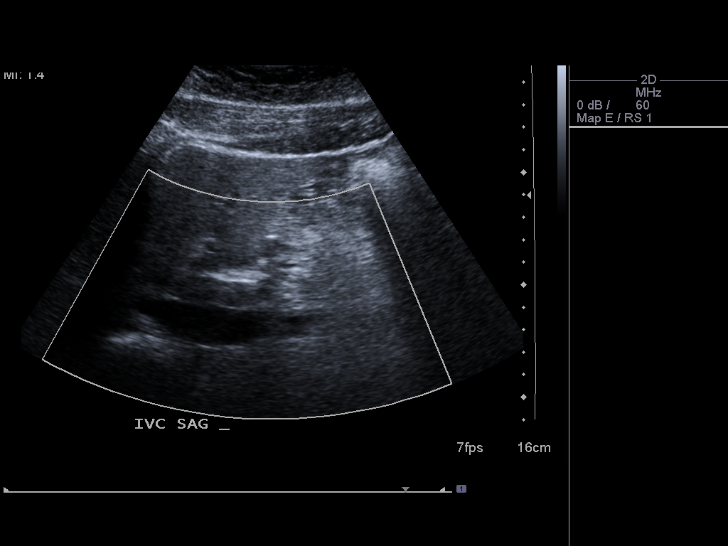
[im 11/61]
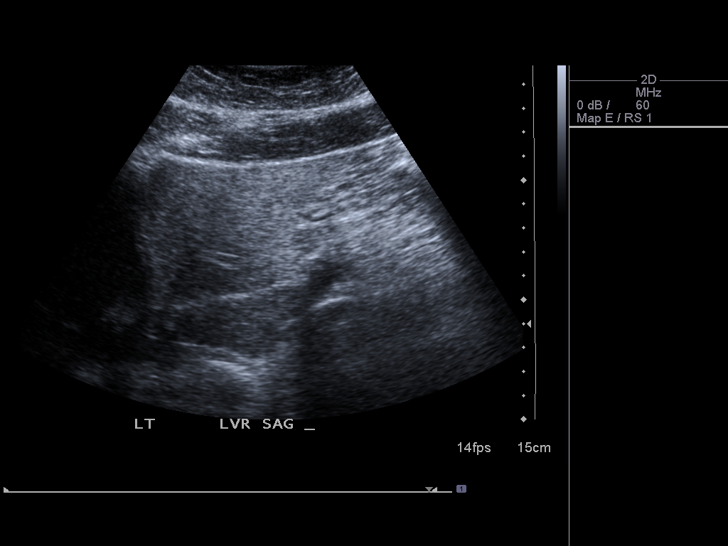
[im 16/61]
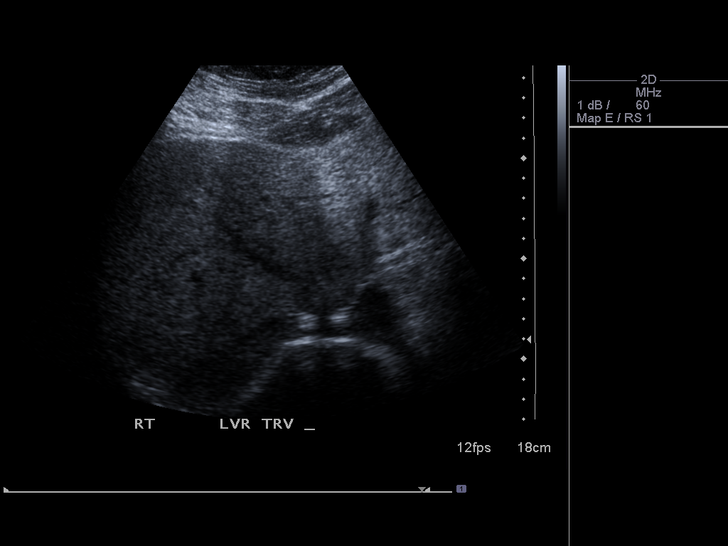
[im 21/61]
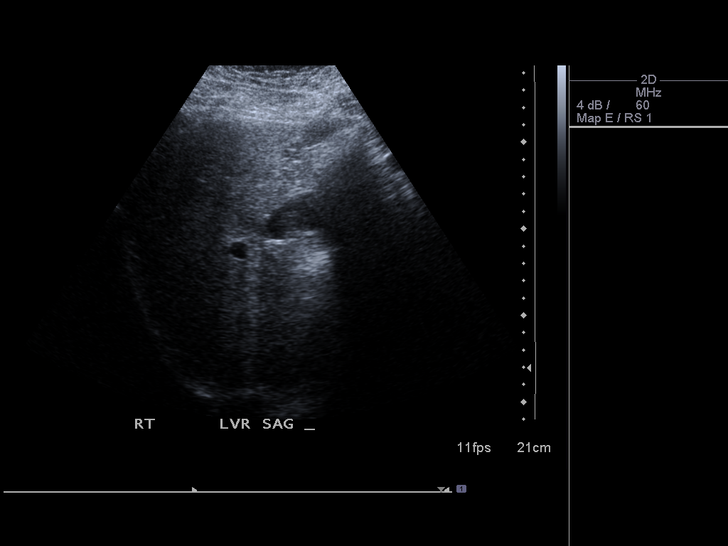
[im 26/61]
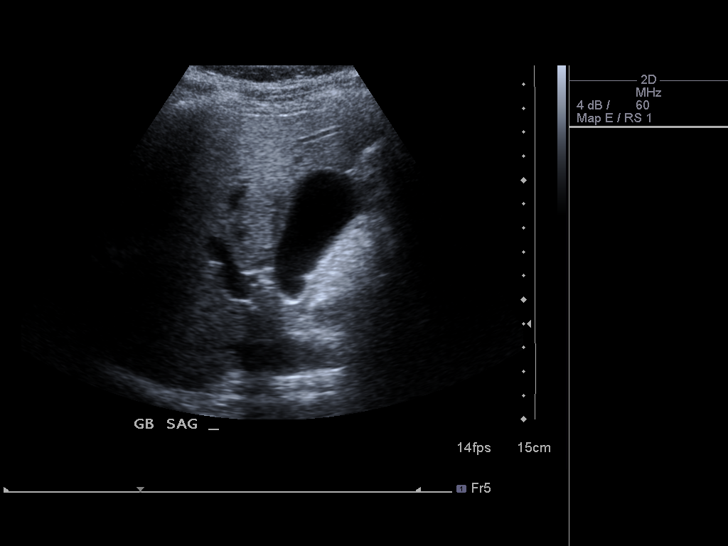
[im 31/61]
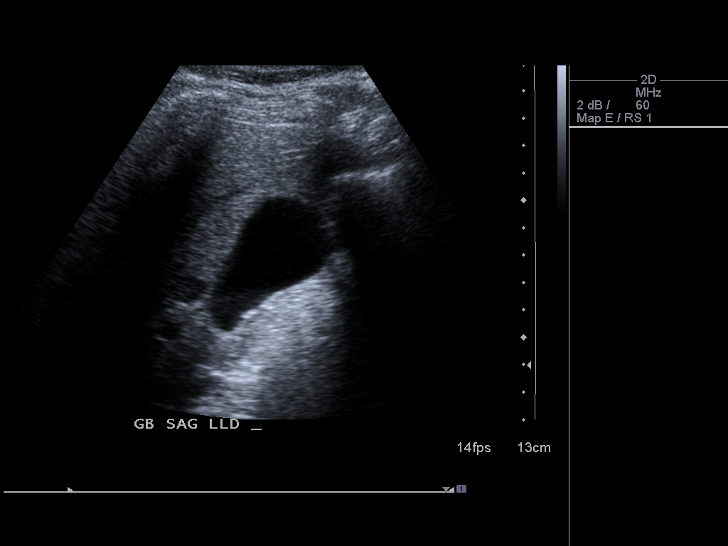
[im 36/61]
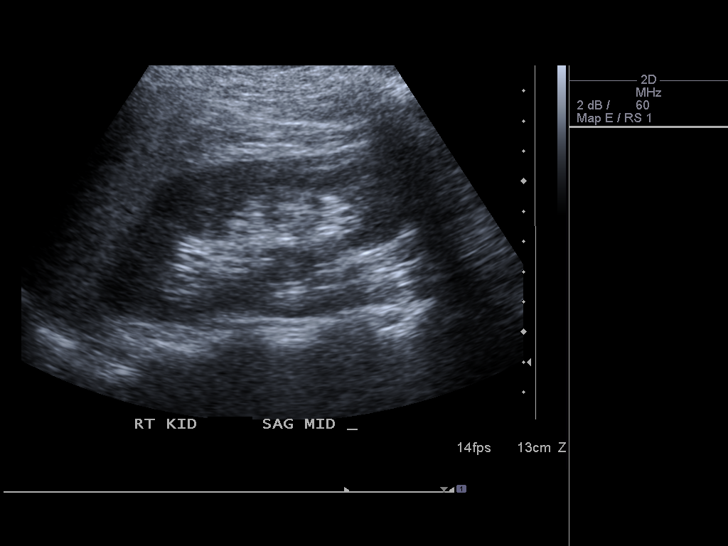
[im 41/61]
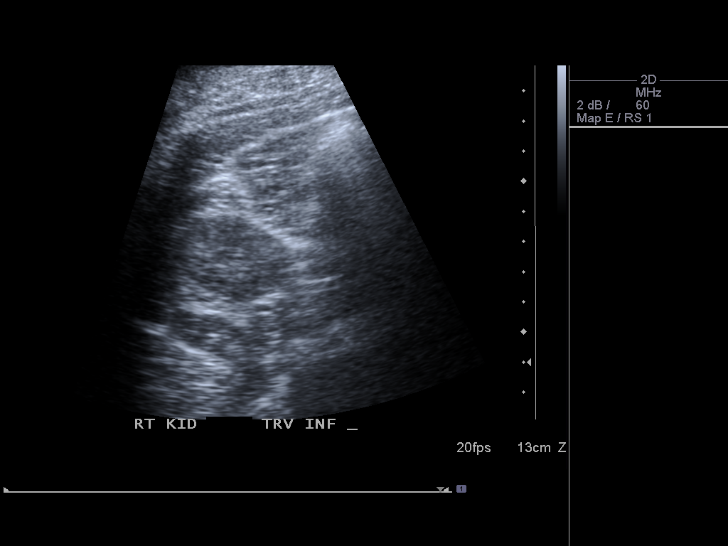
[im 46/61]
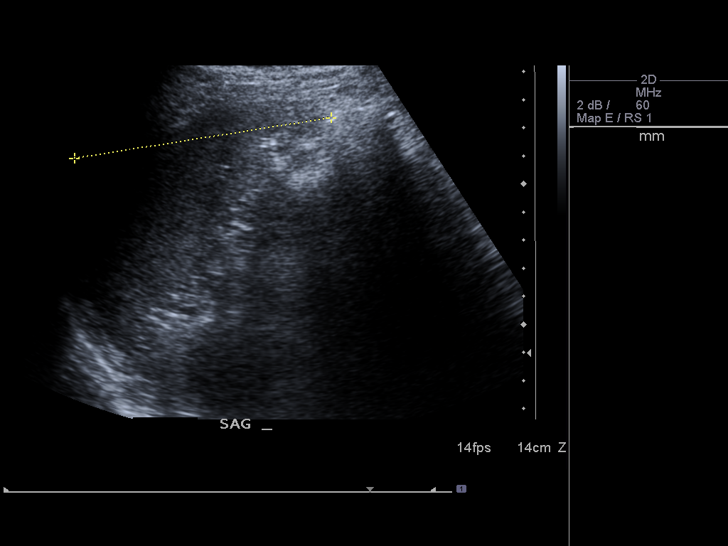
[im 51/61]
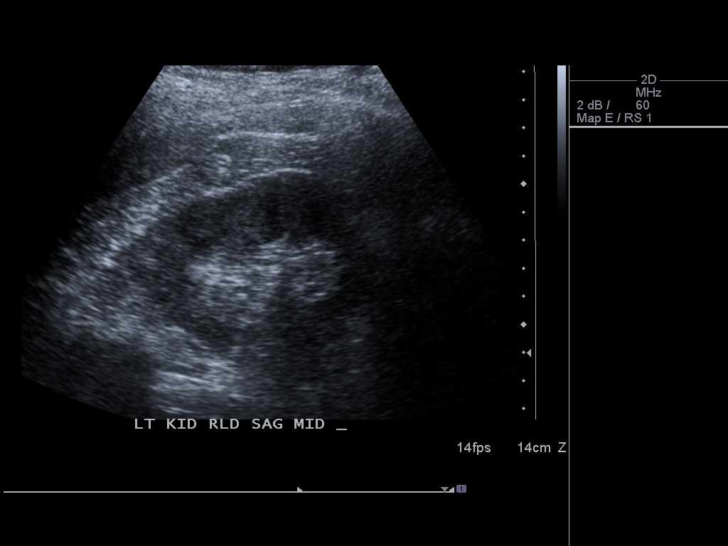
[im 56/61]
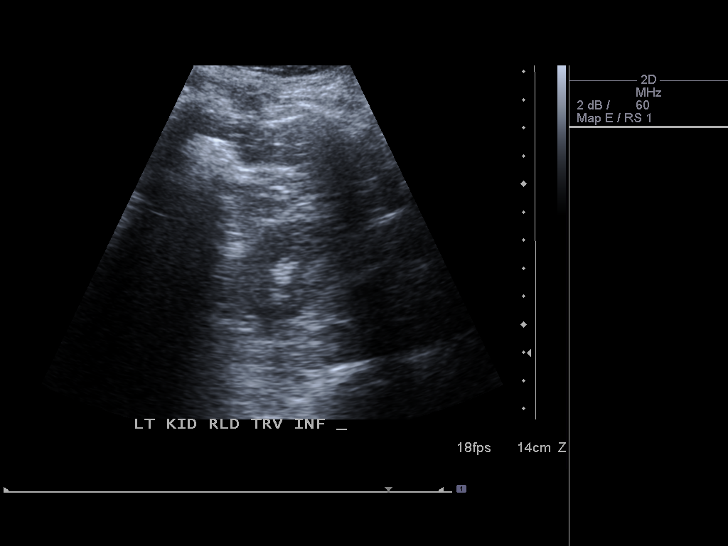
[im 61/61]
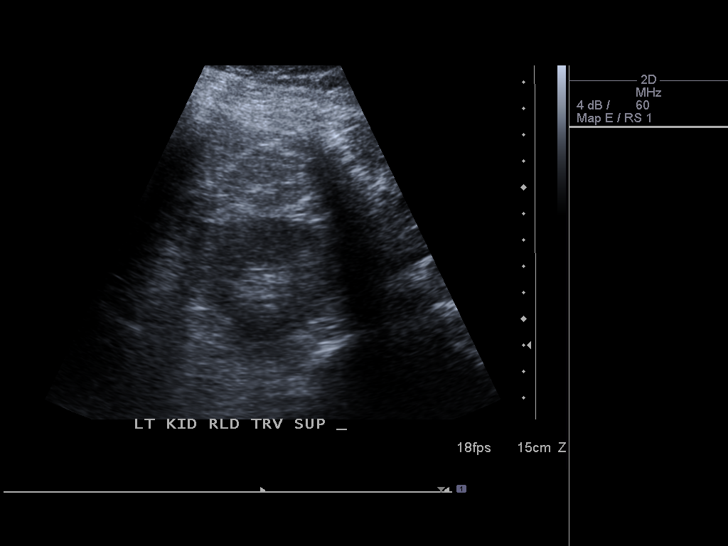

[13 of 25 positions shown; findings below may reference images not displayed]

FINDINGS: Gallbladder:  The gallbladder is well distended shows no evidence
for intraluminal stones or sludge.  No pericholecystic fluid or
gallbladder wall thickening is seen.  Evaluation for a sonographic
Murphy's sign is negative

Common bile duct:  The visualized portion of the common bile duct
measures 3.9 mm in depth and has a normal appearance

Liver:  Demonstrates a diffuse increase in echotexture and is
mildly heterogeneous compatible with underlying hepatic steatosis
or hepatocellular dysfunction.  No focal parenchymal abnormality or
signs of intrahepatic ductal dilatation is identified.  The hepatic
capsule appears smooth

IVC:  The proximal portion appears normal

Pancreas:  Is poorly visualized due to shadowing by overlying gas

Spleen:  Has a length of 5.6 cm.  No focal parenchymal abnormality
is identified

Right Kidney:  Has a sagittal length of 11.4 cm.  No focal
parenchymal abnormalities or signs of hydronephrosis are seen

Left Kidney:  Has a sagittal length of 11.2 cm.  No focal
parenchymal abnormalities or signs of hydronephrosis are seen

Abdominal aorta:  Is incompletely visualized due to shadowing from
overlying gas.  The visualized portion of the aorta has a maximal
caliber of 1.8 cm with no aneurysmal dilatation seen.
IMPRESSION: Heterogeneous increased hepatic echotexture compatible with
underlying hepatic steatosis or hepatocellular dysfunction.

Incomplete assessment of the pancreas and abdominal aorta was
possible due to shadowing by overlying gas.  Otherwise unremarkable

## 2013-08-29 ENCOUNTER — Telehealth: Payer: Self-pay | Admitting: Family

## 2013-08-29 MED ORDER — LORATADINE-PSEUDOEPHEDRINE ER 5-120 MG PO TB12: ORAL_TABLET | ORAL | Status: DC

## 2013-08-29 NOTE — Telephone Encounter (Signed)
Refill sent.

## 2013-08-29 NOTE — Telephone Encounter (Signed)
Refill- alavert

## 2014-04-09 ENCOUNTER — Encounter: Payer: Managed Care, Other (non HMO) | Admitting: Family

## 2014-04-17 ENCOUNTER — Ambulatory Visit (INDEPENDENT_AMBULATORY_CARE_PROVIDER_SITE_OTHER): Payer: Managed Care, Other (non HMO) | Admitting: *Deleted

## 2014-04-17 ENCOUNTER — Encounter: Payer: Self-pay | Admitting: Family

## 2014-04-17 ENCOUNTER — Ambulatory Visit (INDEPENDENT_AMBULATORY_CARE_PROVIDER_SITE_OTHER): Payer: Managed Care, Other (non HMO) | Admitting: Family

## 2014-04-17 VITALS — BP 120/80 | HR 60 | Temp 98.3°F | Ht 70.8 in | Wt 210.0 lb

## 2014-04-17 DIAGNOSIS — Z0000 Encounter for general adult medical examination without abnormal findings: Secondary | ICD-10-CM

## 2014-04-17 DIAGNOSIS — Z23 Encounter for immunization: Secondary | ICD-10-CM

## 2014-04-17 LAB — URINALYSIS, ROUTINE W REFLEX MICROSCOPIC
BILIRUBIN URINE: NEGATIVE
HGB URINE DIPSTICK: NEGATIVE
KETONES UR: NEGATIVE
Leukocytes, UA: NEGATIVE
Nitrite: NEGATIVE
Specific Gravity, Urine: 1.025 (ref 1.000–1.030)
TOTAL PROTEIN, URINE-UPE24: NEGATIVE
URINE GLUCOSE: NEGATIVE
UROBILINOGEN UA: 0.2 (ref 0.0–1.0)
pH: 6 (ref 5.0–8.0)

## 2014-04-17 LAB — LIPID PANEL
Cholesterol: 148 mg/dL (ref 0–200)
HDL: 48.5 mg/dL (ref >39.00)
LDL CALC: 76 mg/dL (ref 0–99)
NONHDL: 99.5
TRIGLYCERIDES: 120 mg/dL (ref 0.0–149.0)
Total CHOL/HDL Ratio: 3
VLDL: 24 mg/dL (ref 0.0–40.0)

## 2014-04-17 LAB — BASIC METABOLIC PANEL
BUN: 14 mg/dL (ref 6–23)
CALCIUM: 9.3 mg/dL (ref 8.4–10.5)
CO2: 21 mEq/L (ref 19–32)
CREATININE: 1 mg/dL (ref 0.4–1.5)
Chloride: 108 mEq/L (ref 96–112)
GFR: 105.71 mL/min (ref >60.00)
GLUCOSE: 103 mg/dL — AB (ref 70–99)
Potassium: 4.3 mEq/L (ref 3.5–5.1)
SODIUM: 136 meq/L (ref 135–145)

## 2014-04-17 LAB — TSH: TSH: 1.35 u[IU]/mL (ref 0.35–4.50)

## 2014-04-17 LAB — HEPATIC FUNCTION PANEL
ALBUMIN: 4.4 g/dL (ref 3.5–5.2)
ALK PHOS: 50 U/L (ref 39–117)
ALT: 50 U/L (ref 0–53)
AST: 33 U/L (ref 0–37)
BILIRUBIN DIRECT: 0.1 mg/dL (ref 0.0–0.3)
Total Bilirubin: 0.7 mg/dL (ref 0.2–1.2)
Total Protein: 7.5 g/dL (ref 6.0–8.3)

## 2014-04-17 LAB — CBC WITH DIFFERENTIAL/PLATELET
BASOS ABS: 0 10*3/uL (ref 0.0–0.1)
Basophils Relative: 0.3 % (ref 0.0–3.0)
EOS ABS: 0.3 10*3/uL (ref 0.0–0.7)
Eosinophils Relative: 3.7 % (ref 0.0–5.0)
HCT: 45.7 % (ref 39.0–52.0)
Hemoglobin: 15 g/dL (ref 13.0–17.0)
LYMPHS PCT: 26 % (ref 12.0–46.0)
Lymphs Abs: 1.9 10*3/uL (ref 0.7–4.0)
MCHC: 32.8 g/dL (ref 30.0–36.0)
MCV: 89.9 fl (ref 78.0–100.0)
MONOS PCT: 4.7 % (ref 3.0–12.0)
Monocytes Absolute: 0.3 10*3/uL (ref 0.1–1.0)
NEUTROS PCT: 65.3 % (ref 43.0–77.0)
Neutro Abs: 4.9 10*3/uL (ref 1.4–7.7)
Platelets: 256 10*3/uL (ref 150.0–400.0)
RBC: 5.08 Mil/uL (ref 4.22–5.81)
RDW: 13 % (ref 11.5–15.5)
WBC: 7.5 10*3/uL (ref 4.0–10.5)

## 2014-04-17 LAB — HIV ANTIBODY (ROUTINE TESTING W REFLEX): HIV 1&2 Ab, 4th Generation: NONREACTIVE

## 2014-04-17 NOTE — Patient Instructions (Signed)
Please complete lab work prior to leaving. Follow up in 1 year, sooner if problems/concerns.  

## 2014-04-17 NOTE — Assessment & Plan Note (Signed)
Continue healthy diet, exercise, weight loss.  Immunizations reviewed and up to date.

## 2014-04-17 NOTE — Progress Notes (Signed)
Subjective:    Patient ID: Gerald Norman, male    DOB: 02/19/1981, 33 y.o.   MRN: [id number]  HPI  Gerald Norman is a 33 yr old male who presents today for cpx.  Patient presents today for complete physical.  Immunizations: up to date Diet: healthy Exercise: reports regular exercise   Review of Systems  Constitutional: Negative for unexpected weight change.  HENT: Negative for rhinorrhea.   Respiratory: Negative for cough and shortness of breath.   Cardiovascular: Negative for chest pain.  Gastrointestinal: Negative for nausea, vomiting and diarrhea.  Genitourinary: Negative for dysuria and frequency.  Musculoskeletal: Negative for myalgias and arthralgias.  Skin: Negative for rash.  Neurological: Negative for headaches.  Hematological: Negative for adenopathy.  Psychiatric/Behavioral:       Denies depression/anxiety   Past Medical History  Diagnosis Date  . Asthma   . GERD (gastroesophageal reflux disease)   . Closed head injury 2002    severe closed head injury--fell off balcony landing on head ([place])  Rt supraorbital fx, rt elbow fx, rt maxillary fx, subdural hematoma, subarachnoid hemorrhage  . Fatty liver 06/27/2012    History   Social History  . Marital Status: Married    Spouse Name: N/A    Number of Children: 0  . Years of Education: N/A   Occupational History  . Not on file.   Social History Main Topics  . Smoking status: Former Games developermoker  . Smokeless tobacco: Never Used  . Alcohol Use: Yes     Comment: 2-3 times a week  . Drug Use: No  . Sexual Activity: Not on file   Other Topics Concern  . Not on file   Social History Narrative   Works at [employer]. Has 2 foster children. No children of his own.   Jonetta SpeakGrandmother--Thelma Scott          Past Surgical History  Procedure Laterality Date  . Eye surgery  2002    Rt. eye socket repair due to accident--had a plate in place    Family History  Problem Relation Age of Onset  . Diabetes     . Hypertension      No Known Allergies  Current Outpatient Prescriptions on File Prior to Visit  Medication Sig Dispense Refill  . fluticasone (FLONASE) 50 MCG/ACT nasal spray PLACE 2 SPRAYS INTO THE NOSE DAILY. 16 g 3  . loratadine-pseudoephedrine (ALAVERT ALLERGY/SINUS) 5-120 MG per tablet TAKE 1 TABLET BY MOUTH 2 (TWO) TIMES DAILY. 60 tablet 3  . Multiple Vitamin (MULTIVITAMIN) tablet Take 1 tablet by mouth daily.       No current facility-administered medications on file prior to visit.    BP 120/80 mmHg  Pulse 60  Temp(Src) 98.3 F (36.8 C)  Ht 5' 10.8" (1.798 m)  Wt 210 lb (95.255 kg)  BMI 29.47 kg/m2  SpO2 97%       Objective:   Physical Exam  Physical Exam  Constitutional: He is oriented to person, place, and time. He appears well-developed and well-nourished. No distress.  HENT:  Head: Normocephalic and atraumatic.  Right Ear: Tympanic membrane and ear canal normal.  Left Ear: Tympanic membrane and ear canal normal.  Mouth/Throat: Oropharynx is clear and moist.  Eyes: Pupils are equal, round, and reactive to light. No scleral icterus.  Neck: Normal range of motion. No thyromegaly present.  Cardiovascular: Normal rate and regular rhythm.   No murmur heard. Pulmonary/Chest: Effort normal and breath sounds normal. No respiratory distress.  He has no wheezes. He has no rales. He exhibits no tenderness.  Abdominal: Soft. Bowel sounds are normal. He exhibits no distension and no mass. There is no tenderness. There is no rebound and no guarding.  Musculoskeletal: He exhibits no edema.  Lymphadenopathy:    He has no cervical adenopathy.  Neurological: He is alert and oriented to person, place, and time. He has normal reflexes. He exhibits normal muscle tone. Coordination normal.  Skin: Skin is warm and dry.  Psychiatric: He has a normal mood and affect. His behavior is normal. Judgment and thought content normal.          Assessment & Plan:           Assessment & Plan:

## 2014-04-18 ENCOUNTER — Telehealth: Payer: Self-pay | Admitting: *Deleted

## 2014-04-18 NOTE — Telephone Encounter (Signed)
Add on form faxed to the lab. 

## 2014-04-18 NOTE — Telephone Encounter (Signed)
-----   Message from Sandford CrazeMelissa O'Sullivan, NP sent at 04/17/2014 10:41 PM EST ----- Please ask lab to add on a1c, dx hyperglycemia.

## 2014-04-19 ENCOUNTER — Other Ambulatory Visit (INDEPENDENT_AMBULATORY_CARE_PROVIDER_SITE_OTHER): Payer: Managed Care, Other (non HMO)

## 2014-04-19 ENCOUNTER — Encounter: Payer: Self-pay | Admitting: Family

## 2014-04-19 DIAGNOSIS — R739 Hyperglycemia, unspecified: Secondary | ICD-10-CM | POA: Insufficient documentation

## 2014-04-19 DIAGNOSIS — R7309 Other abnormal glucose: Secondary | ICD-10-CM

## 2014-04-19 LAB — HEMOGLOBIN A1C: Hgb A1c MFr Bld: 5.9 % (ref 4.6–6.5)

## 2014-04-20 NOTE — Telephone Encounter (Signed)
See my chart message to pt  

## 2014-04-20 NOTE — Telephone Encounter (Signed)
Result is available in EPIC.  Please advise? 

## 2014-05-03 ENCOUNTER — Other Ambulatory Visit: Payer: Self-pay | Admitting: *Deleted

## 2014-05-03 MED ORDER — LORATADINE-PSEUDOEPHEDRINE ER 5-120 MG PO TB12: ORAL_TABLET | ORAL | Status: DC

## 2014-05-03 NOTE — Telephone Encounter (Signed)
Alavert refilled per protocol. JG//CMA

## 2014-05-05 NOTE — Telephone Encounter (Signed)
Please call pt re: unread mychart message

## 2014-05-07 NOTE — Telephone Encounter (Signed)
Attempted to reach pt and received voicemail. Left detailed message to log in to mychart and review 04/19/14 results and call if any questions.

## 2015-07-25 ENCOUNTER — Other Ambulatory Visit: Payer: Self-pay | Admitting: Family

## 2015-07-26 NOTE — Telephone Encounter (Signed)
Pt has not been seen since 04/2014. Refill denied. Needs appointment for physical.

## 2016-02-27 ENCOUNTER — Ambulatory Visit (INDEPENDENT_AMBULATORY_CARE_PROVIDER_SITE_OTHER): Payer: BC Managed Care – PPO | Admitting: Family Medicine

## 2016-02-27 VITALS — BP 120/60 | HR 70 | Temp 97.9°F | Resp 16 | Ht 69.5 in | Wt 208.0 lb

## 2016-02-27 DIAGNOSIS — Z23 Encounter for immunization: Secondary | ICD-10-CM | POA: Diagnosis not present

## 2016-02-27 DIAGNOSIS — Z0000 Encounter for general adult medical examination without abnormal findings: Secondary | ICD-10-CM

## 2016-02-27 NOTE — Patient Instructions (Addendum)
  It was good to meet you today!  I have completed your paperwork.  Everything looks good from my standpoint.    Best of luck with foster care   IF you received an x-ray today, you will receive an invoice from [facility]. Please contact [facility] at [phone] with questions or concerns regarding your invoice.   IF you received labwork today, you will receive an invoice from [facility]. Please contact [facility] at [phone] with questions or concerns regarding your invoice.   Our billing staff will not be able to assist you with questions regarding bills from these companies.  You will be contacted with the lab results as soon as they are available. The fastest way to get your results is to activate your My Chart account. Instructions are located on the last page of this paperwork. If you have not heard from us regarding the results in 2 weeks, please contact this office.

## 2016-02-27 NOTE — Progress Notes (Signed)

## 2016-02-27 NOTE — Progress Notes (Signed)
Gerald Norman is a 35 y.o. male who presents to [facility] today for comprehensive physical examination:  CPE:  Physical exam needed for patient to become a foster parent.  [facility] with [facility] today.  He has no risk factors for tuberculosis. Has not left the country. [place]. He has no cough, night sweats.  Concerns:  None, doing well.   Last physical never.  Eye exam never.;  No glasses. Dental exam every six months.   PMH reviewed. Patient is a nonsmoker.   Past Medical History:  Diagnosis Date  . Allergy   . Asthma   . Closed head injury 2002   severe closed head injury--fell off balcony landing [place] head [place])  Rt supraorbital fx, rt elbow fx, rt maxillary fx, subdural hematoma, subarachnoid hemorrhage  . Fatty liver 06/27/2012  . GERD (gastroesophageal reflux disease)    Past Surgical History:  Procedure Laterality Date  . EYE SURGERY  2002   Rt. eye socket repair due to accident--had a plate in place    Medications reviewed. Current Outpatient Prescriptions  Medication Sig Dispense Refill  . fluticasone (FLONASE) 50 MCG/ACT nasal spray PLACE 2 SPRAYS INTO THE NOSE DAILY. 16 g 3  . loratadine-pseudoephedrine (ALAVERT ALLERGY/SINUS) 5-120 MG per tablet TAKE 1 TABLET BY MOUTH 2 (TWO) TIMES DAILY. 60 tablet 3  . Multiple Vitamin (MULTIVITAMIN) tablet Take 1 tablet by mouth daily.       No current facility-administered medications for this visit.     Social: Smoking history:  denies Alcohol use:  occasional Illicit drug use:  denies Relationship status:   married Occupation:  Works in [employer] in [employer]  Review of Systems  Constitutional: Negative for fever.  HENT: Negative for congestion, ear discharge, ear pain and hearing loss.   Eyes: Negative for blurred vision.  Respiratory: Negative for cough and wheezing.   Cardiovascular: Negative for chest pain,  palpitations and leg swelling.  Gastrointestinal: Negative for nausea, vomiting and abdominal pain.  Genitourinary: Negative for dysuria, hematuria and flank pain.  Musculoskeletal: Negative for neck pain.  Skin: Negative for rash.  Neurological: Negative for dizziness and headaches.  Psychiatric/Behavioral: Negative for depression and suicidal ideas.   Exam: BP 120/60 (BP Location: Right Arm, Patient Position: Sitting, Cuff Size: Normal)   Pulse 70   Temp 97.9 F (36.6 C) (Oral)   Resp 16   Ht 5' 9.5" (1.765 m)   Wt 208 lb (94.3 kg)   SpO2 96%   BMI 30.28 kg/m  Gen:  Alert, cooperative patient who appears stated age in no acute distress.  Vital signs reviewed. Head: [place]/AT.   Eyes:  EOMI, PERRL.   Ears:  External ears WNL, Bilateral TM's normal without retraction, redness or bulging. Nose:  Septum midline  Mouth:  MMM, tonsils non-erythematous, non-edematous.   Neck: No masses or thyromegaly or limitation in range of motion.  No cervical lymphadenopathy. Pulm:  Clear to auscultation bilaterally with good air movement.  No wheezes or rales noted.   Cardiac:  Regular rate and rhythm without murmur auscultated.  Good S1/S2. Abd:  Soft/nondistended/nontender.  Good bowel sounds throughout all four quadrants.  No masses noted.  Ext:  No clubbing/cyanosis/erythema.  No edema noted bilateral lower extremities.   Neuro:  Grossly normal, no gait abnormalities Psych:  Not depressed or anxious appearing.  Conversant and engaged  Impression/Plan: 1. Physical exam for fostering via  Social Services:  NO red flags. No history of psych disease.  No risk factors for TB.  I have completed his paperwork here today.   - He has a regular PCP follows up for usual care.  No labs needed today.  FU with PCP for his complete physical exam.

## 2016-04-24 ENCOUNTER — Telehealth: Payer: Self-pay | Admitting: Family

## 2016-04-24 NOTE — Telephone Encounter (Signed)
Needs OV prior to form completion.

## 2016-04-24 NOTE — Telephone Encounter (Signed)
Patient called with questions regarding CPE. [facility]

## 2016-04-24 NOTE — Telephone Encounter (Signed)
Received call from pt's wife stating pt needed cpe to complete forms for foster care and was unable to be seen here due to time constraints. Pt went to urgent care and had forms completed. He submitted paperwork to the state. They sent them back stating it had to come from his PCP. Wife is wanting to know if they fax us records from urgent care if we will complete paperwork. Pt has not been seen by us since 2015.

## 2016-04-24 NOTE — Telephone Encounter (Signed)
Notified pt's spouse. She states they are requesting form completion as soon as possible. Scheduled pt to see Dr Carmelia RollerWendling on 04/29/16 at 8:30am in PCP's absence.

## 2016-04-29 ENCOUNTER — Encounter: Payer: Self-pay | Admitting: Family Medicine

## 2016-04-29 ENCOUNTER — Ambulatory Visit (INDEPENDENT_AMBULATORY_CARE_PROVIDER_SITE_OTHER): Payer: BC Managed Care – PPO | Admitting: Family Medicine

## 2016-04-29 VITALS — BP 112/66 | HR 61 | Temp 98.1°F | Ht 71.0 in | Wt 217.4 lb

## 2016-04-29 DIAGNOSIS — Z6830 Body mass index (BMI) 30.0-30.9, adult: Secondary | ICD-10-CM

## 2016-04-29 DIAGNOSIS — J302 Other seasonal allergic rhinitis: Secondary | ICD-10-CM | POA: Diagnosis not present

## 2016-04-29 DIAGNOSIS — E6609 Other obesity due to excess calories: Secondary | ICD-10-CM

## 2016-04-29 DIAGNOSIS — Z0000 Encounter for general adult medical examination without abnormal findings: Secondary | ICD-10-CM

## 2016-04-29 DIAGNOSIS — R7303 Prediabetes: Secondary | ICD-10-CM | POA: Diagnosis not present

## 2016-04-29 DIAGNOSIS — Z13220 Encounter for screening for lipoid disorders: Secondary | ICD-10-CM | POA: Diagnosis not present

## 2016-04-29 LAB — COMPREHENSIVE METABOLIC PANEL
ALT: 33 U/L (ref 0–53)
AST: 29 U/L (ref 0–37)
Albumin: 3.9 g/dL (ref 3.5–5.2)
Alkaline Phosphatase: 58 U/L (ref 39–117)
BUN: 13 mg/dL (ref 6–23)
CO2: 29 meq/L (ref 19–32)
Calcium: 8.6 mg/dL (ref 8.4–10.5)
Chloride: 108 mEq/L (ref 96–112)
Creatinine, Ser: 1.04 mg/dL (ref 0.40–1.50)
GFR: 104.44 mL/min (ref >60.00)
GLUCOSE: 103 mg/dL — AB (ref 70–99)
POTASSIUM: 4.3 meq/L (ref 3.5–5.1)
Sodium: 142 mEq/L (ref 135–145)
TOTAL PROTEIN: 6.1 g/dL (ref 6.0–8.3)
Total Bilirubin: 0.5 mg/dL (ref 0.2–1.2)

## 2016-04-29 LAB — LIPID PANEL
CHOL/HDL RATIO: 3
Cholesterol: 146 mg/dL (ref 0–200)
HDL: 48.2 mg/dL (ref >39.00)
LDL Cholesterol: 77 mg/dL (ref 0–99)
NONHDL: 98.08
Triglycerides: 107 mg/dL (ref 0.0–149.0)
VLDL: 21.4 mg/dL (ref 0.0–40.0)

## 2016-04-29 LAB — HEMOGLOBIN A1C: HEMOGLOBIN A1C: 5.8 % (ref 4.6–6.5)

## 2016-04-29 MED ORDER — LORATADINE-PSEUDOEPHEDRINE ER 5-120 MG PO TB12: ORAL_TABLET | ORAL | 3 refills | Status: DC

## 2016-04-29 MED FILL — ALAVERT D-12 ALLERGY-SINUS: 5-120 | 30 days supply | Qty: 60 | Fill #0

## 2016-04-29 NOTE — Progress Notes (Signed)
Chief Complaint  Patient presents with  . Annual Exam    form to be completed for work     Well Male Gerald Norman is here for a complete physical.   His last physical was >1 year ago.  Current diet: in general, a "healthy" diet   Current exercise: active with yard work during summer Weight trend: stable Does pt snore? No. Daytime fatigue? No. Seat belt? Yes.   Needs form filled out for work.  Past Medical History:  Diagnosis Date  . Allergy   . Closed head injury 2002   severe closed head injury--fell off balcony landing on head ([place])  Rt supraorbital fx, rt elbow fx, rt maxillary fx, subdural hematoma, subarachnoid hemorrhage  . Fatty liver 06/27/2012    Past Surgical History:  Procedure Laterality Date  . EYE SURGERY  2002   Rt. eye socket repair due to accident--had a plate in place   Medications  Current Outpatient Prescriptions on File Prior to Visit  Medication Sig Dispense Refill  . fluticasone (FLONASE) 50 MCG/ACT nasal spray PLACE 2 SPRAYS INTO THE NOSE DAILY. 16 g 3  . loratadine-pseudoephedrine (ALAVERT ALLERGY/SINUS) 5-120 MG per tablet TAKE 1 TABLET BY MOUTH 2 (TWO) TIMES DAILY. 60 tablet 3  . Multiple Vitamin (MULTIVITAMIN) tablet Take 1 tablet by mouth daily.       Allergies No Known Allergies Family History Family History  Problem Relation Age of Onset  . Diabetes    . Hypertension    . Diabetes Mother   . Dementia Father     Review of Systems: Constitutional:  no unexpected change in weight, no fevers or chills Eye:  no recent significant change in vision Ear/Nose/Mouth/Throat:  Ears:  no tinnitus or hearing loss Nose/Mouth/Throat:  no complaints of nasal congestion or bleeding, no sore throat and oral sores Cardiovascular:  no chest pain, no palpitations Respiratory:  no cough and no shortness of breath Gastrointestinal:  no abdominal pain, no change in bowel habits, no nausea, vomiting, diarrhea, or constipation and no black or bloody  stool GU:  Male: negative for dysuria, frequency, and incontinence and negative for prostate symptoms Musculoskeletal/Extremities:  no pain, redness, or swelling of the joints Integumentary (Skin/Breast):  no abnormal skin lesions reported Neurologic:  no headaches, no numbness, tingling Endocrine:  weight changes, masses in the neck, heat/cold intolerance, bowel or skin changes, or cardiovascular system symptoms Hematologic/Lymphatic:  no abnormal bleeding, no HIV risk factors, no night sweats, no swollen nodes, no weight loss  Exam BP 112/66 (BP Location: Right Arm, Patient Position: Sitting, Cuff Size: Small)   Pulse 61   Temp 98.1 F (36.7 C) (Oral)   Ht 5\' 11"  (1.803 m)   Wt 217 lb 6.4 oz (98.6 kg)   SpO2 97%   BMI 30.32 kg/m  General:  well developed, well nourished, in no apparent distress Skin:  no significant moles, warts, or growths Head:  no masses, lesions, or tenderness Eyes:  pupils equal and round, sclera anicteric without injection Ears:  canals without lesions, TMs shiny without retraction, no obvious effusion, no erythema Nose:  nares patent, septum midline, mucosa normal Throat/Pharynx:  lips and gingiva without lesion; tongue and uvula midline; non-inflamed pharynx; no exudates or postnasal drainage Neck: neck supple without adenopathy, thyromegaly, or masses Lungs:  clear to auscultation, breath sounds equal bilaterally, no respiratory distress Cardio:  regular rate and rhythm without murmurs, heart sounds without clicks or rubs Abdomen:  abdomen soft, nontender; bowel sounds normal;  no masses or organomegaly Genital (male): Uncircumcised penis, no lesions or discharge; testes present bilaterally without masses or tenderness Rectal: Deferred Musculoskeletal:  symmetrical muscle groups noted without atrophy or deformity Extremities:  no clubbing, cyanosis, or edema, no deformities, no skin discoloration Neuro:  gait normal; deep tendon reflexes normal and  symmetric Psych: well oriented with normal range of affect and appropriate judgment/insight  Assessment and Plan  Well adult exam  Chronic seasonal allergic rhinitis, unspecified trigger - Plan: loratadine-pseudoephedrine (ALAVERT ALLERGY/SINUS) 5-120 MG tablet  Class 1 obesity due to excess calories without serious comorbidity with body mass index (BMI) of 30.0 to 30.9 in adult - Plan: Comprehensive metabolic panel  Screening cholesterol level - Plan: Lipid panel  Prediabetes - Plan: Hemoglobin A1c   Well 35 y.o. male. Counseled on diet and exercise. Further orders as above. Follow up as originally scheduled with reg PCP pending the above workup. The patient voiced understanding and agreement to the plan.  Jilda Rocheicholas Paul Fort ValleyWendling, DO 04/29/16 8:45 AM

## 2016-04-29 NOTE — Progress Notes (Signed)
Pre visit review using our clinic review tool, if applicable. No additional management support is needed unless otherwise documented below in the visit note. 

## 2016-04-29 NOTE — Patient Instructions (Signed)
Try to make some time for scheduled exercise (walking, etc) during winter months when you are not as active with yard work.

## 2016-07-16 MED FILL — ALAVERT D-12 ALLERGY-SINUS: 5-120 | 30 days supply | Qty: 60 | Fill #1

## 2016-09-18 MED FILL — ALAVERT D-12 ALLERGY-SINUS: 5-120 | 30 days supply | Qty: 60 | Fill #2

## 2016-11-20 MED FILL — ALAVERT D-12 ALLERGY-SINUS: 5-120 | 30 days supply | Qty: 60 | Fill #3

## 2017-05-03 ENCOUNTER — Encounter: Payer: BC Managed Care – PPO | Admitting: Family

## 2017-09-01 ENCOUNTER — Other Ambulatory Visit: Payer: Self-pay | Admitting: Family Medicine

## 2017-09-01 DIAGNOSIS — J302 Other seasonal allergic rhinitis: Secondary | ICD-10-CM

## 2017-09-01 MED FILL — ALAVERT D-12 ALLERGY-SINUS: 5-120 | 12 days supply | Qty: 24 | Fill #0

## 2018-03-04 ENCOUNTER — Ambulatory Visit: Payer: BC Managed Care – PPO | Admitting: Family

## 2018-03-07 ENCOUNTER — Telehealth: Payer: Self-pay | Admitting: *Deleted

## 2018-03-07 NOTE — Telephone Encounter (Signed)
Copied from CRM [id number]. Topic: General - Other >> Mar 04, 2018  3:56 PM Laural Benes, [place] C wrote: Reason for CRM:   pt called in at 3:40p to make provider aware that he is running behind. 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 Per Nicki [place], reschedule pt. pt says for insurance he has to have before 03/11/18. pt didnt reschedule. he said that he would have to go to [facility] to have CPE completed to meet deadline.

## 2018-03-07 NOTE — Telephone Encounter (Signed)
Noted  

## 2019-02-10 ENCOUNTER — Other Ambulatory Visit: Payer: Self-pay

## 2019-02-10 ENCOUNTER — Encounter: Payer: Self-pay | Admitting: Family

## 2019-02-10 ENCOUNTER — Ambulatory Visit (INDEPENDENT_AMBULATORY_CARE_PROVIDER_SITE_OTHER): Payer: BC Managed Care – PPO | Admitting: Family

## 2019-02-10 VITALS — BP 136/96 | HR 60 | Temp 97.1°F | Resp 16 | Ht 71.0 in | Wt 216.0 lb

## 2019-02-10 DIAGNOSIS — R030 Elevated blood-pressure reading, without diagnosis of hypertension: Secondary | ICD-10-CM | POA: Diagnosis not present

## 2019-02-10 DIAGNOSIS — Z23 Encounter for immunization: Secondary | ICD-10-CM | POA: Diagnosis not present

## 2019-02-10 DIAGNOSIS — Z0001 Encounter for general adult medical examination with abnormal findings: Secondary | ICD-10-CM | POA: Diagnosis not present

## 2019-02-10 DIAGNOSIS — Z0000 Encounter for general adult medical examination without abnormal findings: Secondary | ICD-10-CM

## 2019-02-10 MED ORDER — LORATADINE 10 MG PO TABS: 10.0000 mg | ORAL_TABLET | Freq: Every day | ORAL | 11 refills | Status: DC

## 2019-02-10 NOTE — Progress Notes (Signed)
Subjective:    Patient ID: Gerald Norman, male    DOB: [birth year], 38 y.o.   MRN: [id number]  HPI  Patient presents today for complete physical.  Immunizations: td 8/10, flu shot today Diet:  Healthy Wt Readings from Last 3 Encounters:  02/10/19 216 lb (98 kg)  04/29/16 217 lb 6.4 oz (98.6 kg)  02/27/16 208 lb (94.3 kg)  Exercise: exercises at home Vision: last eye exam unknown Dental:  due  Review of Systems  Constitutional: Negative for unexpected weight change.  HENT: Negative for hearing loss.   Eyes: Negative for visual disturbance.  Respiratory: Negative for cough and shortness of breath.   Cardiovascular: Negative for chest pain.  Gastrointestinal: Negative for constipation and diarrhea.  Genitourinary: Negative for dysuria, frequency and hematuria.  Musculoskeletal: Negative for arthralgias and myalgias.  Skin: Negative for rash.  Neurological: Negative for headaches.  Hematological: Negative for adenopathy.  Psychiatric/Behavioral:       Denies depression/anxiety   Past Medical History:  Diagnosis Date  . Allergy   . Closed head injury 2002   severe closed head injury--fell off balcony landing on head (Dr Towanda Malkin)  Rt supraorbital fx, rt elbow fx, rt maxillary fx, subdural hematoma, subarachnoid hemorrhage  . Fatty liver 06/27/2012     Social History   Socioeconomic History  . Marital status: Married    Spouse name: Not on file  . Number of children: 0  . Years of education: Not on file  . Highest education level: Not on file  Occupational History  . Not on file  Social Needs  . Financial resource strain: Not on file  . Food insecurity    Worry: Not on file    Inability: Not on file  . Transportation needs    Medical: Not on file    Non-medical: Not on file  Tobacco Use  . Smoking status: Former Research scientist (life sciences)  . Smokeless tobacco: Never Used  Substance and Sexual Activity  . Alcohol use: Yes    Comment: 2-3 times a week  . Drug use: No  . Sexual  activity: Yes    Partners: Female  Lifestyle  . Physical activity    Days per week: Not on file    Minutes per session: Not on file  . Stress: Not on file  Relationships  . Social Herbalist on phone: Not on file    Gets together: Not on file    Attends religious service: Not on file    Active member of club or organization: Not on file    Attends meetings of clubs or organizations: Not on file    Relationship status: Not on file  . Intimate partner violence    Fear of current or ex partner: Not on file    Emotionally abused: Not on file    Physically abused: Not on file    Forced sexual activity: Not on file  Other Topics Concern  . 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    2 children age 52 and 2    No pets at home    Past Surgical History:  Procedure Laterality Date  . EYE SURGERY  2002   Rt. eye socket repair due to accident--had a plate in place    Family History  Problem Relation Age of Onset  . Diabetes Other   . Hypertension Other   .  Diabetes Mother   . Dementia Father     No Known Allergies  Current Outpatient Medications on File Prior to Visit  Medication Sig Dispense Refill  . fluticasone (FLONASE) 50 MCG/ACT nasal spray PLACE 2 SPRAYS INTO THE NOSE DAILY. 16 g 3  . loratadine-pseudoephedrine (ALAVERT ALLERGY/SINUS) 5-120 MG tablet TAKE 1 TABLET BY MOUTH 2 (TWO) TIMES DAILY. 60 tablet 3  . Multiple Vitamin (MULTIVITAMIN) tablet Take 1 tablet by mouth daily.       No current facility-administered medications on file prior to visit.     BP (!) 136/96 (BP Location: Left Arm, Patient Position: Sitting, Cuff Size: Large)   Pulse 60   Temp (!) 97.1 F (36.2 C) (Temporal)   Resp 16   Ht 5\' 11"  (1.803 m)   Wt 216 lb (98 kg)   SpO2 99%   BMI 30.13 kg/m       Objective:   Physical Exam  Physical Exam  Constitutional: He is oriented to person, place,  and time. He appears well-developed and well-nourished. No distress.  HENT:  Head: Normocephalic and atraumatic.  Right Ear: Tympanic membrane and ear canal normal.  Left Ear: Tympanic membrane and ear canal normal.  Mouth/Throat: Not examined- pt wearing mask for covid precautions Eyes: Pupils are equal, round, and reactive to light. No scleral icterus.  Neck: Normal range of motion. No thyromegaly present.  Cardiovascular: Normal rate and regular rhythm.   No murmur heard. Pulmonary/Chest: Effort normal and breath sounds normal. No respiratory distress. He has no wheezes. He has no rales. He exhibits no tenderness.  Abdominal: Soft. Bowel sounds are normal. He exhibits no distension and no mass. There is no tenderness. There is no rebound and no guarding.  Musculoskeletal: He exhibits no edema.  Lymphadenopathy:    He has no cervical adenopathy.  Neurological: He is alert and oriented to person, place, and time. He has normal patellar reflexes. He exhibits normal muscle tone. Coordination normal.  Skin: Skin is warm and dry.  Psychiatric: He has a normal mood and affect. His behavior is normal. Judgment and thought content normal.           Assessment & Plan:   Preventative care- discussed healthy diet, exercise, weight loss.  Obtain routine lab work. Tdap and flu shot today.    Elevated blood pressure reading- ? If sudafed is contributing. Advised pt to d/c alavert and instead begin claritin. Work on low sodium diet. Plan repeat bp in 1 month. If still elevated will need to begin anti-hypertensive.   BP Readings from Last 3 Encounters:  02/10/19 (!) 136/96  04/29/16 112/66  02/27/16 120/60        Assessment & Plan:

## 2019-02-10 NOTE — Patient Instructions (Addendum)
Please schedule a routine dental and eye exams. Please discontinue alavert and switch to plain claritin.

## 2020-04-02 ENCOUNTER — Encounter: Payer: BC Managed Care – PPO | Admitting: Family

## 2020-04-23 ENCOUNTER — Ambulatory Visit (INDEPENDENT_AMBULATORY_CARE_PROVIDER_SITE_OTHER): Payer: BC Managed Care – PPO | Admitting: Family

## 2020-04-23 ENCOUNTER — Encounter: Payer: Self-pay | Admitting: Family

## 2020-04-23 ENCOUNTER — Other Ambulatory Visit: Payer: Self-pay

## 2020-04-23 VITALS — BP 149/89 | HR 67 | Temp 98.9°F | Resp 16 | Ht 70.0 in | Wt 213.0 lb

## 2020-04-23 DIAGNOSIS — Z0000 Encounter for general adult medical examination without abnormal findings: Secondary | ICD-10-CM | POA: Diagnosis not present

## 2020-04-23 DIAGNOSIS — R030 Elevated blood-pressure reading, without diagnosis of hypertension: Secondary | ICD-10-CM

## 2020-04-23 DIAGNOSIS — Z23 Encounter for immunization: Secondary | ICD-10-CM | POA: Diagnosis not present

## 2020-04-23 MED ORDER — AMLODIPINE BESYLATE 5 MG PO TABS: 5.0000 mg | ORAL_TABLET | Freq: Every day | ORAL | 3 refills | Status: DC

## 2020-04-23 NOTE — Patient Instructions (Addendum)
Please schedule a routine eye exam.   Continue healthy diet and regular exercise.  

## 2020-04-23 NOTE — Progress Notes (Signed)
Subjective:    Patient ID: Gerald Norman, male    DOB: 1980-06-19, 39 y.o.   MRN: [id number]  HPI  Patient presents today for complete physical.  Immunizations:  Flu shot today. Tdap 2020- reports that he has had covid shot  Diet: healthy Exercise: regular exercise Wt Readings from Last 3 Encounters:  04/23/20 213 lb (96.6 kg)  02/10/19 216 lb (98 kg)  04/29/16 217 lb 6.4 oz (98.6 kg)  Vision: due Dental: up to date  Tested positive for covid in early November- had a mild case   Review of Systems  Constitutional: Negative for unexpected weight change.  HENT: Negative for rhinorrhea.   Respiratory: Negative for cough and shortness of breath.   Cardiovascular: Negative for chest pain.  Gastrointestinal: Negative for blood in stool, constipation and diarrhea.  Genitourinary: Negative for dysuria, frequency and hematuria.  Musculoskeletal: Negative for arthralgias and myalgias.  Skin: Negative for rash.  Neurological: Negative for headaches.  Hematological: Negative for adenopathy.  Psychiatric/Behavioral:       Denies depression/anxiety.        Past Medical History:  Diagnosis Date  . Allergy   . Closed head injury 2002   severe closed head injury--fell off balcony landing on head (Dr Shon Hough)  Rt supraorbital fx, rt elbow fx, rt maxillary fx, subdural hematoma, subarachnoid hemorrhage  . Fatty liver 06/27/2012     Social History   Socioeconomic History  . Marital status: Married    Spouse name: Not on file  . Number of children: 0  . Years of education: Not on file  . Highest education level: Not on file  Occupational History  . Not on file  Tobacco Use  . Smoking status: Former Games developer  . Smokeless tobacco: Never Used  Substance and Sexual Activity  . Alcohol use: Yes    Comment: 2-3 times a week  . Drug use: No  . Sexual activity: Yes    Partners: Female  Other Topics Concern  . 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    2 children age 44 and 2    No pets at home   Social Determinants of Health   Financial Resource Strain: Not on file  Food Insecurity: Not on file  Transportation Needs: Not on file  Physical Activity: Not on file  Stress: Not on file  Social Connections: Not on file  Intimate Partner Violence: Not on file    Past Surgical History:  Procedure Laterality Date  . EYE SURGERY  2002   Rt. eye socket repair due to accident--had a plate in place    Family History  Problem Relation Age of Onset  . Diabetes Other   . Hypertension Other   . Diabetes Mother   . Dementia Father     No Known Allergies  Current Outpatient Medications on File Prior to Visit  Medication Sig Dispense Refill  . fluticasone (FLONASE) 50 MCG/ACT nasal spray PLACE 2 SPRAYS INTO THE NOSE DAILY. 16 g 3  . loratadine (CLARITIN) 10 MG tablet Take 1 tablet (10 mg total) by mouth daily. 30 tablet 11  . Multiple Vitamin (MULTIVITAMIN) tablet Take 1 tablet by mouth daily.       No current facility-administered medications on file prior to visit.    BP (!) 149/89 (BP Location: Right Arm, Patient Position: Sitting, Cuff Size: Large)   Pulse 67  Temp 98.9 F (37.2 C) (Oral)   Resp 16   Ht 5\' 10"  (1.778 m)   Wt 213 lb (96.6 kg)   SpO2 100%   BMI 30.56 kg/m    Objective:   Physical Exam  Physical Exam  Constitutional: He is oriented to person, place, and time. He appears well-developed and well-nourished. No distress.  HENT:  Head: Normocephalic and atraumatic.  Right Ear: Tympanic membrane and ear canal normal.  Left Ear: Tympanic membrane and ear canal normal.  Mouth/Throat:Not examined- pt wearing mask Eyes: Pupils are equal, round, and reactive to light. No scleral icterus.  Neck: Normal range of motion. No thyromegaly present.  Cardiovascular: Normal rate and regular rhythm.   No murmur heard. Pulmonary/Chest: Effort normal and breath  sounds normal. No respiratory distress. He has no wheezes. He has no rales. He exhibits no tenderness.  Abdominal: Soft. Bowel sounds are normal. He exhibits no distension and no mass. There is no tenderness. There is no rebound and no guarding.  Musculoskeletal: He exhibits no edema.  Lymphadenopathy:    He has no cervical adenopathy.  Neurological: He is alert and oriented to person, place, and time. He has normal patellar reflexes. He exhibits normal muscle tone. Coordination normal.  Skin: Skin is warm and dry.  Psychiatric: He has a normal mood and affect. His behavior is normal. Judgment and thought content normal.           Assessment & Plan:   Preventative care- encouraged pt to continue healthy diet and regular exercise. Immunizations reviewed and up to date.        Assessment & Plan:  Elevated blood pressure reading- discussed starting amlodipine. Repeat manual bp check 150/92.  After I sent amlodipine he told me that he began taking sudafed again and took a dose this AM.  Pt is advised as follows:  Stop all sudafed containing products.  You may use plain alavert or plain claritin- not "D" products. Amlodipine 5mg  has been sent to your pharmacy- let's hold off on starting this until we see how your blood pressure looks off of the sudafed.  BP Readings from Last 3 Encounters:  04/23/20 (!) 149/89  02/10/19 (!) 136/96  04/29/16 112/66      This visit occurred during the SARS-CoV-2 public health emergency.  Safety protocols were in place, including screening questions prior to the visit, additional usage of staff PPE, and extensive cleaning of exam room while observing appropriate contact time as indicated for disinfecting solutions.

## 2020-05-07 ENCOUNTER — Ambulatory Visit: Payer: BC Managed Care – PPO | Admitting: Family

## 2020-05-21 ENCOUNTER — Ambulatory Visit: Payer: BC Managed Care – PPO | Admitting: Family

## 2020-05-23 ENCOUNTER — Other Ambulatory Visit: Payer: Self-pay

## 2020-05-23 ENCOUNTER — Ambulatory Visit: Payer: BC Managed Care – PPO | Admitting: Family

## 2020-05-23 ENCOUNTER — Encounter: Payer: Self-pay | Admitting: Family

## 2020-05-23 VITALS — BP 129/71 | HR 81 | Temp 97.8°F | Resp 16 | Wt 218.0 lb

## 2020-05-23 DIAGNOSIS — R030 Elevated blood-pressure reading, without diagnosis of hypertension: Secondary | ICD-10-CM

## 2020-05-23 NOTE — Progress Notes (Signed)
Subjective:    Patient ID: Gerald Norman, male    DOB: [birth year], 41 y.o.   MRN: [id number]   HPI  40 yr old male who presents today for follow up.  Last visit bp was elevated and we asked him to discontinue all sudafed containing products.  BP Readings from Last 3 Encounters:  05/23/20 129/71  04/23/20 (!) 149/89  02/10/19 (!) 136/96     Review of Systems    see HPI  Past Medical History:  Diagnosis Date  . Allergy   . Closed head injury 2002   severe closed head injury--fell off balcony landing on head (Dr Shon Hough)  Rt supraorbital fx, rt elbow fx, rt maxillary fx, subdural hematoma, subarachnoid hemorrhage  . Fatty liver 06/27/2012     Social History   Socioeconomic History  . Marital status: Married    Spouse name: Not on file  . Number of children: 0  . Years of education: Not on file  . Highest education level: Not on file  Occupational History  . Not on file  Tobacco Use  . Smoking status: Former Games developer  . Smokeless tobacco: Never Used  Substance and Sexual Activity  . Alcohol use: Yes    Comment: 2-3 times a week  . Drug use: No  . Sexual activity: Yes    Partners: Female  Other Topics Concern  . 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    2 children age 32 and 2    No pets at home   Social Determinants of Health   Financial Resource Strain: Not on file  Food Insecurity: Not on file  Transportation Needs: Not on file  Physical Activity: Not on file  Stress: Not on file  Social Connections: Not on file  Intimate Partner Violence: Not on file    Past Surgical History:  Procedure Laterality Date  . EYE SURGERY  2002   Rt. eye socket repair due to accident--had a plate in place    Family History  Problem Relation Age of Onset  . Diabetes Other   . Hypertension Other   . Diabetes Mother   . Dementia Father   . Hypertension Father   . Hypertension  Maternal Grandmother   . COPD Maternal Grandmother   . Cancer Maternal Grandfather   . Dementia Paternal Grandmother   . Cancer Paternal Grandfather     No Known Allergies  Current Outpatient Medications on File Prior to Visit  Medication Sig Dispense Refill  . amLODipine (NORVASC) 5 MG tablet Take 1 tablet (5 mg total) by mouth daily. 30 tablet 3  . fluticasone (FLONASE) 50 MCG/ACT nasal spray PLACE 2 SPRAYS INTO THE NOSE DAILY. 16 g 3  . loratadine (CLARITIN) 10 MG tablet Take 1 tablet (10 mg total) by mouth daily. 30 tablet 11  . Multiple Vitamin (MULTIVITAMIN) tablet Take 1 tablet by mouth daily.     No current facility-administered medications on file prior to visit.    There were no vitals taken for this visit.   Objective:   Physical Exam Constitutional:      General: He is not in acute distress.    Appearance: He is well-developed and well-nourished.  HENT:     Head: Normocephalic and atraumatic.  Cardiovascular:     Rate and Rhythm: Normal rate and regular rhythm.     Heart sounds: No murmur heard.  Pulmonary:     Effort: Pulmonary effort is normal. No respiratory distress.     Breath sounds: Normal breath sounds. No wheezing or rales.  Musculoskeletal:        General: No edema.  Skin:    General: Skin is warm and dry.  Neurological:     Mental Status: He is alert and oriented to person, place, and time.  Psychiatric:        Mood and Affect: Mood and affect normal.        Behavior: Behavior normal.        Thought Content: Thought content normal.           Assessment & Plan:  HTN- bp stable/improved off of sudafed. Advised pt to remain off of sudafed products, work on low sodium diet and follow up in 6 months for bp recheck.  This visit occurred during the SARS-CoV-2 public health emergency.  Safety protocols were in place, including screening questions prior to the visit, additional usage of staff PPE, and extensive cleaning of exam room while  observing appropriate contact time as indicated for disinfecting solutions.

## 2020-11-20 ENCOUNTER — Ambulatory Visit: Payer: BC Managed Care – PPO | Admitting: Family

## 2020-12-04 ENCOUNTER — Ambulatory Visit: Payer: BC Managed Care – PPO | Admitting: Family

## 2021-06-10 ENCOUNTER — Ambulatory Visit: Payer: BC Managed Care – PPO | Admitting: Family Medicine

## 2021-06-10 ENCOUNTER — Telehealth: Payer: Self-pay | Admitting: Family

## 2021-06-10 ENCOUNTER — Encounter: Payer: Self-pay | Admitting: Family Medicine

## 2021-06-10 VITALS — BP 120/80 | HR 75 | Temp 98.4°F | Ht 71.0 in | Wt 205.1 lb

## 2021-06-10 DIAGNOSIS — H1033 Unspecified acute conjunctivitis, bilateral: Secondary | ICD-10-CM | POA: Diagnosis not present

## 2021-06-10 MED ORDER — POLYMYXIN B-TRIMETHOPRIM 10000-0.1 UNIT/ML-% OP SOLN: 1.0000 [drp] | OPHTHALMIC | 0 refills | Status: DC

## 2021-06-10 NOTE — Patient Instructions (Signed)
Artificial tears like Refresh and Systane may be used for comfort. OK to get generic version. Generally people use them every 2-4 hours, but you can use them as much as you want because there is no medication in it.  Try not to touch your face/eyes.  Warm compresses can be helpful.   OK to take Tylenol 1000 mg (2 extra strength tabs) or 975 mg (3 regular strength tabs) every 6 hours as needed.  Ibuprofen 400-600 mg (2-3 over the counter strength tabs) every 6 hours as needed for pain.  Let us know if you need anything.

## 2021-06-10 NOTE — Telephone Encounter (Signed)
Pharmacy stated they are completely out of trimethoprim-polymyxin b (POLYTRIM) ophthalmic solution they may have a substitution depending on what the Dr is trying to treat. Please advise.   [facility] ([address]), [place]  [id number] ([place]) [place] [address]  Phone:  [phone]  Fax:  [phone]

## 2021-06-10 NOTE — Progress Notes (Signed)
Chief Complaint  Patient presents with   Conjunctivitis    Sore throat     Gerald Norman is here for bilateral eye irritation.  Duration: 2 days Chemical exposure? No  +drainage b/l Recent URI? No  Contact lenses? No  History of allergies?Yes Daughter had pink eye, going thru daycare Treatment to date: Nyquil, BC powder  Past Medical History:  Diagnosis Date   Allergy    Closed head injury 2002   severe closed head injury--fell off balcony landing on head (Dr Towanda Malkin)  Rt supraorbital fx, rt elbow fx, rt maxillary fx, subdural hematoma, subarachnoid hemorrhage   Fatty liver 06/27/2012   Family History  Problem Relation Age of Onset   Diabetes Other    Hypertension Other    Diabetes Mother    Dementia Father    Hypertension Father    Hypertension Maternal Grandmother    COPD Maternal Grandmother    Cancer Maternal Grandfather    Dementia Paternal Grandmother    Cancer Paternal Grandfather     BP 120/80    Pulse 75    Temp 98.4 F (36.9 C) (Oral)    Ht 5\' 11"  (1.803 m)    Wt 205 lb 2 oz (93 kg)    SpO2 99%    BMI 28.61 kg/m  Gen: Awake, alert, appears stated age Eyes: Lids injected, Sclera injected, PERRLA, EOMi Nose: Nares patent without discharge Mouth: MMM, pharynx without erythema or exudate Psych: Age appropriate judgment and insight; mood and affect normal  Acute conjunctivitis of both eyes, unspecified acute conjunctivitis type - Plan: trimethoprim-polymyxin b (POLYTRIM) ophthalmic solution  Instructed to practice good hand hygiene and try not to touch face. Warm compresses and artificial tears also recommended. F/u if no improvement prn. Pt voiced understanding and agreement to the plan.  [place], DO 06/10/21 2:47 PM

## 2021-06-11 MED ORDER — TOBRAMYCIN 0.3 % OP SOLN: 1.0000 [drp] | OPHTHALMIC | 0 refills | Status: DC

## 2021-06-11 NOTE — Telephone Encounter (Signed)
Replacement sent

## 2022-01-30 ENCOUNTER — Encounter: Payer: BC Managed Care – PPO | Admitting: Family

## 2022-02-11 ENCOUNTER — Ambulatory Visit (INDEPENDENT_AMBULATORY_CARE_PROVIDER_SITE_OTHER): Payer: BC Managed Care – PPO | Admitting: Family

## 2022-02-11 ENCOUNTER — Encounter: Payer: Self-pay | Admitting: Family

## 2022-02-11 VITALS — BP 134/80 | HR 57 | Temp 98.1°F | Resp 16 | Ht 69.5 in | Wt 190.0 lb

## 2022-02-11 DIAGNOSIS — Z125 Encounter for screening for malignant neoplasm of prostate: Secondary | ICD-10-CM

## 2022-02-11 DIAGNOSIS — R739 Hyperglycemia, unspecified: Secondary | ICD-10-CM

## 2022-02-11 DIAGNOSIS — Z0000 Encounter for general adult medical examination without abnormal findings: Secondary | ICD-10-CM

## 2022-02-11 DIAGNOSIS — Z23 Encounter for immunization: Secondary | ICD-10-CM

## 2022-02-11 DIAGNOSIS — Z13220 Encounter for screening for lipoid disorders: Secondary | ICD-10-CM | POA: Diagnosis not present

## 2022-02-11 LAB — LIPID PANEL
Cholesterol: 203 mg/dL — ABNORMAL HIGH (ref 0–200)
HDL: 85.5 mg/dL (ref >39.00)
LDL Cholesterol: 105 mg/dL — ABNORMAL HIGH (ref 0–99)
NonHDL: 117.27
Total CHOL/HDL Ratio: 2
Triglycerides: 61 mg/dL (ref 0.0–149.0)
VLDL: 12.2 mg/dL (ref 0.0–40.0)

## 2022-02-11 LAB — COMPREHENSIVE METABOLIC PANEL
ALT: 25 U/L (ref 0–53)
AST: 25 U/L (ref 0–37)
Albumin: 4 g/dL (ref 3.5–5.2)
Alkaline Phosphatase: 42 U/L (ref 39–117)
BUN: 12 mg/dL (ref 6–23)
CO2: 31 mEq/L (ref 19–32)
Calcium: 9.2 mg/dL (ref 8.4–10.5)
Chloride: 105 mEq/L (ref 96–112)
Creatinine, Ser: 1 mg/dL (ref 0.40–1.50)
GFR: 93.87 mL/min (ref >60.00)
Glucose, Bld: 99 mg/dL (ref 70–99)
Potassium: 4.7 mEq/L (ref 3.5–5.1)
Sodium: 140 mEq/L (ref 135–145)
Total Bilirubin: 0.3 mg/dL (ref 0.2–1.2)
Total Protein: 6.4 g/dL (ref 6.0–8.3)

## 2022-02-11 LAB — HEMOGLOBIN A1C: Hgb A1c MFr Bld: 6.2 % (ref 4.6–6.5)

## 2022-02-11 LAB — PSA: PSA: 0.49 ng/mL (ref 0.10–4.00)

## 2022-02-11 NOTE — Progress Notes (Signed)
Subjective:   By signing my name below, I, Shehryar Baig, attest that this documentation has been prepared under the direction and in the presence of Debbrah Alar, NP 02/11/2022    Patient ID: Gerald Norman, male    DOB: [birth year], 41 y.o.   MRN: [id number]  Chief Complaint  Patient presents with   Annual Exam         HPI Patient is in today for a comprehensive physical exam.   Blood pressure- His blood pressure is doing well during this visit.  BP Readings from Last 3 Encounters:  02/11/22 134/80  06/10/21 120/80  05/23/20 129/71   Pulse Readings from Last 3 Encounters:  02/11/22 (!) 57  06/10/21 75  05/23/20 81   Acute: He denies unexpected weight gain, adenopathy, Fever, New moles, congestion, sinus pain, sore throat, visual disturbance, chest pain, palpitaitons, leg swelling, shortness of breath, wheezing, cough, Nausea, vomiting, diarrhea, consitpation, blood in stool, dysuria, frequency, hematuria, new joint pain, new muscle pain, frequent headaches, dizziness, depression, or anxiety.   Social history: He has no new surgical procedures to report. He has no changes to his family medical history. He drinks around 2-3 drinks every week. He does not use drugs. He does not use tobacco or vaping products.   Immunizations: He is UTD on flu vaccines this year. He is UTD on tetanus vaccines. He was recommended to received the new Covid-19 booster vaccine.   PSA: He is interested in checking his PSA levels during his next blood work.   Diet: He is maintaining a balanced diet.   Exercise: He participates in occasional exercise.   Dental: He is UTD on dental care.   Vision: He is UTD on vision care.    Health Maintenance Due  Topic Date Due   COVID-19 Vaccine (4 - Pfizer series) 06/01/2020    Past Medical History:  Diagnosis Date   Allergy    Closed head injury 2002   severe closed head injury--fell off balcony landing on head (Dr Towanda Malkin)  Rt supraorbital  fx, rt elbow fx, rt maxillary fx, subdural hematoma, subarachnoid hemorrhage   Fatty liver 06/27/2012    Past Surgical History:  Procedure Laterality Date   EYE SURGERY  2002   Rt. eye socket repair due to accident--had a plate in place    Family History  Problem Relation Age of Onset   Diabetes Other    Hypertension Other    Diabetes Mother    Dementia Father    Hypertension Father    Hypertension Maternal Grandmother    COPD Maternal Grandmother    Cancer Maternal Grandfather    Dementia Paternal Grandmother    Cancer Paternal Grandfather     Social History   Socioeconomic History   Marital status: Married    Spouse name: Not on file   Number of children: 0   Years of education: Not on file   Highest education level: Not on file  Occupational History   Not on file  Tobacco Use   Smoking status: Former   Smokeless tobacco: Never  Substance and Sexual Activity   Alcohol use: Yes    Comment: 2-3 times a week   Drug use: No   Sexual activity: Yes    Partners: Female  Other Topics Concern   Not on file  Social History Narrative   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 16 g 3   loratadine (CLARITIN) 10 MG tablet Take 1 tablet (10 mg total) by mouth daily. 30 tablet 11   Multiple Vitamin (MULTIVITAMIN) tablet Take 1 tablet by mouth daily.     tobramycin (TOBREX) 0.3 % ophthalmic solution Place 1 drop into both eyes every 4 (four)  hours. 5 mL 0   No facility-administered medications prior to visit.    No Known Allergies  Review of Systems  Constitutional:  Negative for fever.       (-)unexpected weight change (-)Adenopathy  HENT:  Negative for congestion, sinus pain and sore throat.   Eyes:        (-)Visual disturbance  Respiratory:  Negative for cough, shortness of breath and wheezing.   Cardiovascular:  Negative for chest pain, palpitations and leg swelling.  Gastrointestinal:  Negative for blood in stool, constipation, diarrhea, nausea and vomiting.  Genitourinary:  Negative for dysuria, frequency and hematuria.  Musculoskeletal:        (-)new muscle pain (-)new joint pain  Skin:        (-)new moles  Neurological:  Negative for dizziness and headaches.  Psychiatric/Behavioral:  Negative for depression. The patient is not nervous/anxious.        Objective:    Physical Exam Constitutional:      General: He is not in acute distress.    Appearance: Normal appearance. He is not ill-appearing.  HENT:     Head: Normocephalic and atraumatic.     Right Ear: Tympanic membrane, ear canal and external ear normal.     Left Ear: Tympanic membrane, ear canal and external ear normal.  Eyes:     Extraocular Movements: Extraocular movements intact.     Right eye: No nystagmus.     Left eye: No nystagmus.     Pupils: Pupils are equal, round, and reactive to light.  Cardiovascular:     Rate and Rhythm: Normal rate and regular rhythm.     Heart sounds: Normal heart sounds. No murmur heard.    No gallop.  Pulmonary:     Effort: Pulmonary effort is normal. No respiratory distress.     Breath sounds: Normal breath sounds. No wheezing or rales.  Abdominal:     General: Bowel sounds are normal. There is no distension.     Palpations: Abdomen is soft.     Tenderness: There is no abdominal tenderness. There is no guarding.  Musculoskeletal:     Comments: 5/5 strength in both upper and lower extremities  Skin:     General: Skin is warm and dry.  Neurological:     Mental Status: He is alert and oriented to person, place, and time.     Deep Tendon Reflexes:     Reflex Scores:      Patellar reflexes are 1+ on the right side and 1+ on the left side. Psychiatric:        Judgment: Judgment normal.     BP 134/80 (BP Location: Right Arm, Patient Position:  Sitting, Cuff Size: Small)   Pulse (!) 57   Temp 98.1 F (36.7 C) (Oral)   Resp 16   Ht 5' 9.5" (1.765 m)   Wt 190 lb (86.2 kg)   SpO2 100%   BMI 27.66 kg/m  Wt Readings from Last 3 Encounters:  02/11/22 190 lb (86.2 kg)  06/10/21 205 lb 2 oz (93 kg)  05/23/20 218 lb (98.9 kg)       Assessment & Plan:   Problem List Items Addressed This Visit       Unprioritized   Routine general medical examination at a health care facility    Continue healthy diet and regular exercise. Flu shot today.      Hyperglycemia   Relevant Orders   Comp Met (CMET)   Hemoglobin A1c   Other Visit Diagnoses     Needs flu shot    -  Primary   Relevant Orders   Flu Vaccine QUAD 6+ mos PF IM (Fluarix Quad PF) (Completed)   Prostate cancer screening       Relevant Orders   PSA   Screening for hyperlipidemia       Relevant Orders   Lipid panel        No orders of the defined types were placed in this encounter.   I, Nance Pear, NP, personally preformed the services described in this documentation.  All medical record entries made by the scribe were at my direction and in my presence.  I have reviewed the chart and discharge instructions (if applicable) and agree that the record reflects my personal performance and is accurate and complete. 02/11/2022   I,Shehryar Baig,acting as a Education administrator for Nance Pear, NP.,have documented all relevant documentation on the behalf of Nance Pear, NP,as directed by  Nance Pear, NP while in the presence of Nance Pear, NP.   Nance Pear, NP

## 2022-02-11 NOTE — Assessment & Plan Note (Signed)
Continue healthy diet and regular exercise. Flu shot today.

## 2022-02-13 ENCOUNTER — Telehealth: Payer: Self-pay | Admitting: Family

## 2022-02-13 NOTE — Telephone Encounter (Signed)
A1C is in the borderline diabetes range. Cholesterol mildly elevated. Please work on low fat reduced sugar diet and regular exercise.  10-15 pound weight loss can also help reduce these numbers.   PSA- prostate cancer screening test is negative.

## 2022-02-16 NOTE — Telephone Encounter (Signed)
Left voice mail for patient to call about his results

## 2022-02-16 NOTE — Telephone Encounter (Signed)
Patient notified of results.

## 2022-05-27 ENCOUNTER — Telehealth: Payer: Self-pay | Admitting: Family

## 2022-05-27 ENCOUNTER — Other Ambulatory Visit (HOSPITAL_BASED_OUTPATIENT_CLINIC_OR_DEPARTMENT_OTHER): Payer: Self-pay

## 2022-05-27 ENCOUNTER — Other Ambulatory Visit: Payer: Self-pay

## 2022-05-27 MED ORDER — LORATADINE 10 MG PO TABS: 10.0000 mg | ORAL_TABLET | Freq: Every day | ORAL | 11 refills | Status: DC

## 2022-05-27 NOTE — Telephone Encounter (Signed)
Prescription Request  05/27/2022  Is this a "Controlled Substance" medicine? No  LOV: 02/11/2022  What is the name of the medication or equipment?   loratadine (CLARITIN) 10 MG tablet [[id number]]   Have you contacted your pharmacy to request a refill? Yes   Which pharmacy would you like this sent to?   [facility] ([address] [place] - [address] [place]) [place] [address] Phone: [phone] Fax: [phone]   Patient notified that their request is being sent to the clinical staff for review and that they should receive a response within 2 business days.   Please advise at Mobile [phone] (mobile)

## 2022-05-27 NOTE — Telephone Encounter (Signed)
Rx sent 

## 2022-06-06 ENCOUNTER — Other Ambulatory Visit: Payer: Self-pay

## 2022-06-06 ENCOUNTER — Emergency Department (HOSPITAL_BASED_OUTPATIENT_CLINIC_OR_DEPARTMENT_OTHER)
Admission: EM | Admit: 2022-06-06 | Discharge: 2022-06-06 | Disposition: A | Discharge status: Home / Self Care | Payer: BC Managed Care – PPO | Attending: Emergency Medicine | Admitting: Emergency Medicine

## 2022-06-06 ENCOUNTER — Emergency Department (HOSPITAL_BASED_OUTPATIENT_CLINIC_OR_DEPARTMENT_OTHER): Payer: BC Managed Care – PPO

## 2022-06-06 ENCOUNTER — Encounter (HOSPITAL_BASED_OUTPATIENT_CLINIC_OR_DEPARTMENT_OTHER): Payer: Self-pay

## 2022-06-06 DIAGNOSIS — R519 Headache, unspecified: Secondary | ICD-10-CM | POA: Diagnosis not present

## 2022-06-06 DIAGNOSIS — D72829 Elevated white blood cell count, unspecified: Secondary | ICD-10-CM | POA: Diagnosis not present

## 2022-06-06 LAB — CBC WITH DIFFERENTIAL/PLATELET
Abs Immature Granulocytes: 0.1 10*3/uL — ABNORMAL HIGH (ref 0.00–0.07)
Basophils Absolute: 0 10*3/uL (ref 0.0–0.1)
Basophils Relative: 0 %
Eosinophils Absolute: 0 10*3/uL (ref 0.0–0.5)
Eosinophils Relative: 0 %
HCT: 43.8 % (ref 39.0–52.0)
Hemoglobin: 14.7 g/dL (ref 13.0–17.0)
Immature Granulocytes: 1 %
Lymphocytes Relative: 7 %
Lymphs Abs: 1.4 10*3/uL (ref 0.7–4.0)
MCH: 30.2 pg (ref 26.0–34.0)
MCHC: 33.6 g/dL (ref 30.0–36.0)
MCV: 90.1 fL (ref 80.0–100.0)
Monocytes Absolute: 0.7 10*3/uL (ref 0.1–1.0)
Monocytes Relative: 4 %
Neutro Abs: 17 10*3/uL — ABNORMAL HIGH (ref 1.7–7.7)
Neutrophils Relative %: 88 %
Platelets: 283 10*3/uL (ref 150–400)
RBC: 4.86 MIL/uL (ref 4.22–5.81)
RDW: 12.9 % (ref 11.5–15.5)
WBC: 19.3 10*3/uL — ABNORMAL HIGH (ref 4.0–10.5)
nRBC: 0 % (ref 0.0–0.2)

## 2022-06-06 LAB — BASIC METABOLIC PANEL
Anion gap: 8 (ref 5–15)
BUN: 12 mg/dL (ref 6–20)
CO2: 25 mmol/L (ref 22–32)
Calcium: 9 mg/dL (ref 8.9–10.3)
Chloride: 105 mmol/L (ref 98–111)
Creatinine, Ser: 1.03 mg/dL (ref 0.61–1.24)
GFR, Estimated: >60 mL/min (ref >60)
Glucose, Bld: 123 mg/dL — ABNORMAL HIGH (ref 70–99)
Potassium: 4.6 mmol/L (ref 3.5–5.1)
Sodium: 138 mmol/L (ref 135–145)

## 2022-06-06 MED ORDER — KETOROLAC TROMETHAMINE 15 MG/ML IJ SOLN
15.0000 mg | Freq: Once | INTRAMUSCULAR | Status: AC
  Administered 2022-06-06: 15 mg via INTRAVENOUS
  Filled 2022-06-06: qty 1

## 2022-06-06 MED ORDER — DEXAMETHASONE SODIUM PHOSPHATE 10 MG/ML IJ SOLN
10.0000 mg | Freq: Once | INTRAMUSCULAR | Status: AC
  Administered 2022-06-06: 10 mg via INTRAVENOUS
  Filled 2022-06-06: qty 1

## 2022-06-06 MED ORDER — LACTATED RINGERS IV BOLUS
1000.0000 mL | Freq: Once | INTRAVENOUS | Status: AC
  Administered 2022-06-06: 1000 mL via INTRAVENOUS

## 2022-06-06 MED ORDER — AMLODIPINE BESYLATE 5 MG PO TABS: 5.0000 mg | ORAL_TABLET | Freq: Every day | ORAL | 0 refills | Status: DC

## 2022-06-06 MED ORDER — PROCHLORPERAZINE EDISYLATE 10 MG/2ML IJ SOLN
10.0000 mg | Freq: Once | INTRAMUSCULAR | Status: AC
  Administered 2022-06-06: 10 mg via INTRAVENOUS
  Filled 2022-06-06: qty 2

## 2022-06-06 MED ORDER — IOHEXOL 350 MG/ML SOLN
100.0000 mL | Freq: Once | INTRAVENOUS | Status: AC | PRN
  Administered 2022-06-06: 75 mL via INTRAVENOUS

## 2022-06-06 MED ORDER — ACETAMINOPHEN 500 MG PO TABS
1000.0000 mg | ORAL_TABLET | Freq: Once | ORAL | Status: AC
  Administered 2022-06-06: 1000 mg via ORAL
  Filled 2022-06-06: qty 2

## 2022-06-06 MED ORDER — DIPHENHYDRAMINE HCL 50 MG/ML IJ SOLN
50.0000 mg | Freq: Once | INTRAMUSCULAR | Status: AC
  Administered 2022-06-06: 50 mg via INTRAVENOUS
  Filled 2022-06-06: qty 1

## 2022-06-06 NOTE — ED Provider Notes (Signed)
[facility] Provider Note   CSN: [id number] Arrival date & time: 06/06/22  2047     History Chief Complaint  Patient presents with   Headache    HPI Gerald Norman is a 42 y.o. male presenting for chief complaint of headache.  Patient states he has had severe headache since approximately 10 AM this morning that woke him up from sleep.  No history of headache.  He has had high blood pressure for the last week and has been checking his blood pressure in the outpatient setting.  Minimal medical history otherwise.  Does not take any medicines at this time.  Family history of hypertension per brother at bedside.  Pain is diffuse in his head.  Does not radiate to any temple.  No blurry vision no pain with extraocular motions.  No pain in his neck no audible ringing  Patient's recorded medical, surgical, social, medication list and allergies were reviewed in the Snapshot window as part of the initial history.   Review of Systems   Review of Systems  Constitutional:  Negative for chills and fever.  HENT:  Negative for ear pain and sore throat.   Eyes:  Negative for pain and visual disturbance.  Respiratory:  Negative for cough and shortness of breath.   Cardiovascular:  Negative for chest pain and palpitations.  Gastrointestinal:  Negative for abdominal pain and vomiting.  Genitourinary:  Negative for dysuria and hematuria.  Musculoskeletal:  Negative for arthralgias and back pain.  Skin:  Negative for color change and rash.  Neurological:  Positive for headaches. Negative for seizures and syncope.  All other systems reviewed and are negative.   Physical Exam Updated Vital Signs BP (!) 168/107 (BP Location: Right Arm)   Pulse 68   Temp 98.3 F (36.8 C) (Oral)   Resp 18   Ht 5' 9.5" (1.765 m)   Wt 86.2 kg   SpO2 100%   BMI 27.66 kg/m  Physical Exam Vitals and nursing note reviewed.  Constitutional:      General: He is not in acute  distress.    Appearance: He is well-developed.  HENT:     Head: Normocephalic and atraumatic.  Eyes:     Conjunctiva/sclera: Conjunctivae normal.  Cardiovascular:     Rate and Rhythm: Normal rate and regular rhythm.     Heart sounds: No murmur heard. Pulmonary:     Effort: Pulmonary effort is normal. No respiratory distress.     Breath sounds: Normal breath sounds.  Abdominal:     Palpations: Abdomen is soft.     Tenderness: There is no abdominal tenderness.  Musculoskeletal:        General: No swelling.     Cervical back: Neck supple.  Skin:    General: Skin is warm and dry.     Capillary Refill: Capillary refill takes less than 2 seconds.  Neurological:     Mental Status: He is alert.  Psychiatric:        Mood and Affect: Mood normal.      ED Course/ Medical Decision Making/ A&P    Procedures Procedures   Medications Ordered in ED Medications  prochlorperazine (COMPAZINE) injection 10 mg (10 mg Intravenous Given 06/06/22 2132)  diphenhydrAMINE (BENADRYL) injection 50 mg (50 mg Intravenous Given 06/06/22 2132)  lactated ringers bolus 1,000 mL (0 mLs Intravenous Stopped 06/06/22 2240)  iohexol (OMNIPAQUE) 350 MG/ML injection 100 mL (75 mLs Intravenous Contrast Given 06/06/22 2229)  acetaminophen (TYLENOL) tablet  1,000 mg (1,000 mg Oral Given 06/06/22 2241)  ketorolac (TORADOL) 15 MG/ML injection 15 mg (15 mg Intravenous Given 06/06/22 2241)  dexamethasone (DECADRON) injection 10 mg (10 mg Intravenous Given 06/06/22 2241)    Medical Decision Making:   Gerald Norman is a 42 y.o. male who presented to the ED today with headache detailed above.    Additional history discussed with patient's family/caregivers.  Patient placed on continuous vitals and telemetry monitoring while in ED which was reviewed periodically.   Complete initial physical exam performed, notably the patient  was HDS in NAD, Hypertensive at first though improving.    Reviewed and confirmed nursing documentation  for past medical history, family history, social history.    Initial Assessment:   With the patient's presentation of headache, most likely diagnosis is tension type headaches vs atypical migraines. Other diagnoses were considered including (but not limited to) intracranial mass, intracranial hemorrhage, intracranial infection including meningitis vs encephalitis, GCA, trigeminal neuralgia. These are considered less likely due to history of present illness and physical exam findings.    This is most consistent with an acute life/limb threatening illness complicated by underlying chronic conditions.   Timeline and is lest consistent with SAH/ICH   Age and description of pain is not consistent with GCA   Lack of fever,meningismus is not consistent with meningitis/encephalitis   Initial Plan:  Will initiate treatment with Compazine/Benardryll/NSAIDS/Tylenol for treatment of nonspecific headache  Screening labs including CBC and Metabolic panel to evaluate for infectious or metabolic etiology of disease.  Urinalysis with reflex culture ordered to evaluate for UTI or relevant urologic/nephrologic pathology.  CTA Head to evaluate for structural IC etiology and rule out vascular malformation, intracranial hemorrhage, aneurysmal etiology of patient's headache Objective evaluation as below reviewed   Initial Study Results:   Laboratory  All laboratory results reviewed without evidence of clinically relevant pathology.   Exceptions include: Leukocytosis at 19  EKG EKG was reviewed independently. Rate, rhythm, axis, intervals all examined and without medically relevant abnormality. ST segments without concerns for elevations.    Radiology:  All images reviewed independently. Agree with radiology report at this time.   CT Angio Head W or Wo Contrast  Final Result      Final Assessment and Plan:   Reevaluated at bedside and symptoms and grossly improving symptoms. He is ambulatory tolerating p.o.  intake in no acute distress.  He is sleepy after administration multiple medications but blood pressure has improved.  I discussed the current findings with the patient and wife at bedside.  They feel comfortable with outpatient care and management at this time.  Encephalitis meningitis grossly less likely given improvement of symptoms well appearance though still remains on the differential given slight leukocytosis.  However given symptomatic improvement I do not believe lumbar puncture would be indicated at this time. Patient would like to go home and sleep with plan to return if symptoms are recurrent in a.m. or follow-up with neurology in the outpatient setting. Concerning his high blood pressure, this is a ongoing problem for him.  He is interested in starting therapy.  Will start amlodipine with the plan for patient to follow-up with a primary care provider within 2 weeks.  Disposition:  I have considered need for hospitalization, however, considering all of the above, I believe this patient is stable for discharge at this time.  Patient/family educated about specific return precautions for given chief complaint and symptoms.  Patient/family educated about follow-up with PCP .  Patient/family expressed understanding of return precautions and need for follow-up. Patient spoken to regarding all imaging and laboratory results and appropriate follow up for these results. All education provided in verbal form with additional information in written form. Time was allowed for answering of patient questions. Patient discharged.    Emergency Department Medication Summary:   Medications  prochlorperazine (COMPAZINE) injection 10 mg (10 mg Intravenous Given 06/06/22 2132)  diphenhydrAMINE (BENADRYL) injection 50 mg (50 mg Intravenous Given 06/06/22 2132)  lactated ringers bolus 1,000 mL (0 mLs Intravenous Stopped 06/06/22 2240)  iohexol (OMNIPAQUE) 350 MG/ML injection 100 mL (75 mLs Intravenous Contrast  Given 06/06/22 2229)  acetaminophen (TYLENOL) tablet 1,000 mg (1,000 mg Oral Given 06/06/22 2241)  ketorolac (TORADOL) 15 MG/ML injection 15 mg (15 mg Intravenous Given 06/06/22 2241)  dexamethasone (DECADRON) injection 10 mg (10 mg Intravenous Given 06/06/22 2241)    Clinical Impression:  1. Acute nonintractable headache, unspecified headache type      Discharge   Final Clinical Impression(s) / ED Diagnoses Final diagnoses:  Acute nonintractable headache, unspecified headache type    Rx / DC Orders ED Discharge Orders          Ordered    amLODipine (NORVASC) 5 MG tablet  Daily        06/06/22 2301              Tretha Sciara, MD 06/06/22 2302

## 2022-06-06 NOTE — ED Triage Notes (Signed)
Patient here POV from Home.  Endorses Headache that the Patient noted upon awakening today. Some Photosensitivity. No Fevers. Mild nausea. No Emesis.   Assessed BP at home and it was 170/105. No history of HTN.    NAD noted during Triage. A&Ox4. GCS 15. Ambulatory.

## 2022-06-08 ENCOUNTER — Ambulatory Visit (INDEPENDENT_AMBULATORY_CARE_PROVIDER_SITE_OTHER): Payer: BC Managed Care – PPO | Admitting: Family Medicine

## 2022-06-08 ENCOUNTER — Encounter: Payer: Self-pay | Admitting: Family Medicine

## 2022-06-08 VITALS — BP 131/88 | HR 75 | Temp 98.8°F | Resp 16 | Ht 71.0 in | Wt 183.0 lb

## 2022-06-08 DIAGNOSIS — R519 Headache, unspecified: Secondary | ICD-10-CM

## 2022-06-08 DIAGNOSIS — I10 Essential (primary) hypertension: Secondary | ICD-10-CM | POA: Diagnosis not present

## 2022-06-08 DIAGNOSIS — D72829 Elevated white blood cell count, unspecified: Secondary | ICD-10-CM

## 2022-06-08 DIAGNOSIS — H04123 Dry eye syndrome of bilateral lacrimal glands: Secondary | ICD-10-CM | POA: Diagnosis not present

## 2022-06-08 LAB — CBC WITH DIFFERENTIAL/PLATELET
Basophils Absolute: 0.1 10*3/uL (ref 0.0–0.1)
Basophils Relative: 0.6 % (ref 0.0–3.0)
Eosinophils Absolute: 0.1 10*3/uL (ref 0.0–0.7)
Eosinophils Relative: 0.8 % (ref 0.0–5.0)
HCT: 46.8 % (ref 39.0–52.0)
Hemoglobin: 15.5 g/dL (ref 13.0–17.0)
Lymphocytes Relative: 20.6 % (ref 12.0–46.0)
Lymphs Abs: 3.6 10*3/uL (ref 0.7–4.0)
MCHC: 33.2 g/dL (ref 30.0–36.0)
MCV: 92.2 fl (ref 78.0–100.0)
Monocytes Absolute: 1 10*3/uL (ref 0.1–1.0)
Monocytes Relative: 5.7 % (ref 3.0–12.0)
Neutro Abs: 12.7 10*3/uL — ABNORMAL HIGH (ref 1.4–7.7)
Neutrophils Relative %: 72.3 % (ref 43.0–77.0)
Platelets: 279 10*3/uL (ref 150.0–400.0)
RBC: 5.08 Mil/uL (ref 4.22–5.81)
RDW: 13.8 % (ref 11.5–15.5)
WBC: 17.6 10*3/uL — ABNORMAL HIGH (ref 4.0–10.5)

## 2022-06-08 MED ORDER — AMLODIPINE BESYLATE 5 MG PO TABS: 5.0000 mg | ORAL_TABLET | Freq: Every day | ORAL | 3 refills | Status: DC

## 2022-06-08 NOTE — Patient Instructions (Signed)
Rechecking WBC count today Keep eye doctor appointment today - concern about headache and slightly bulge of left eye Let us know if you would like a neuro referral Continue monitoring blood pressure and symptoms.   Please contact office for follow-up if symptoms do not improve or worsen. Seek emergency care if symptoms become severe.

## 2022-06-08 NOTE — Progress Notes (Signed)
Acute Office Visit  Subjective:     Patient ID: Gerald Norman, male    DOB: [birth year], 42 y.o.   MRN: [id number]  Chief Complaint  Patient presents with   Follow-up   Fatigue    HPI Patient is in today for ED follow-up, headache, hypertension.  Patient went to the ED for headache on 06/06/22. His blood pressure had been high for the past week at home. He was given compazine/benadryl/NSAIDs/tylenol/decadron. CTA head was normal.  CBC with WB 19, otherwise normal labs.  Initial BP in ED was 168/107, but improved to 133/75. He was started on amlodipine.   Patient states that BP has been better controlled since starting the amlodipine. No lower leg edema noted. Headache has slightly improved in intensity, but is still bothersome. Primarily located behind left eye with associated photosensitivity. He has felt a little sluggish since getting the migraine cocktail at the ED. Sleep has been restless. Headache today is 4/10 and possibly having some blurred vision. He is going to see ophthalmology after this appointment. No fevers or URI symptoms.    ROS All review of systems negative except what is listed in the HPI      Objective:    BP 131/88   Pulse 75   Temp 98.8 F (37.1 C)   Resp 16   Ht 5\' 11"  (1.803 m)   Wt 183 lb (83 kg)   SpO2 95%   BMI 25.52 kg/m    Physical Exam Vitals reviewed.  Constitutional:      Appearance: Normal appearance.  HENT:     Head: Normocephalic and atraumatic.     Nose: Nose normal.     Mouth/Throat:     Mouth: Mucous membranes are moist.     Pharynx: Oropharynx is clear.  Eyes:     General: Lids are everted, no foreign bodies appreciated. Gaze aligned appropriately. No allergic shiner or visual field deficit.       Right eye: No discharge.        Left eye: No discharge.     Extraocular Movements: Extraocular movements intact.     Conjunctiva/sclera: Conjunctivae normal.     Pupils: Pupils are equal, round, and reactive to light.      Comments: Slight bulge of left eye (mild exophthalmus)  Cardiovascular:     Rate and Rhythm: Normal rate and regular rhythm.     Pulses: Normal pulses.     Heart sounds: Normal heart sounds.  Pulmonary:     Effort: Pulmonary effort is normal.     Breath sounds: Normal breath sounds.  Musculoskeletal:     Cervical back: Normal range of motion and neck supple. No rigidity or tenderness.  Lymphadenopathy:     Cervical: No cervical adenopathy.  Skin:    General: Skin is warm and dry.  Neurological:     Mental Status: He is alert and oriented to person, place, and time.  Psychiatric:        Mood and Affect: Mood normal.        Behavior: Behavior normal.        Thought Content: Thought content normal.        Judgment: Judgment normal.     No results found for any visits on 06/08/22.      Assessment & Plan:   1. Acute nonintractable headache, unspecified headache type Mild exophthalmus noted, patient thinks this is not baseline. CT scan was normal. Scheduled to see ophthalmology after this appointment. Based on their  workup and if symptoms persist, they will let us know and we can place neuro referral. Headache more consistent with migraine - slightly improved since ED cocktail, but still feeling a little sluggish.  Patient aware of signs/symptoms requiring further/urgent evaluation.  - CBC w/Diff  2. Primary hypertension Much better with taking medication. Continue amlodipine and monitoring at home. - CBC w/Diff - amLODipine (NORVASC) 5 MG tablet; Take 1 tablet (5 mg total) by mouth daily.  Dispense: 30 tablet; Refill: 3  3. Leukocytosis, unspecified type Repeat labs today. No other signs of infection.  - CBC w/Diff  Return if symptoms worsen or fail to improve.  Terrilyn Saver, NP

## 2022-06-08 NOTE — Addendum Note (Signed)
Addended by: Caleen Jobs B on: 06/08/2022 04:56 PM   Modules accepted: Orders

## 2022-06-09 ENCOUNTER — Telehealth (HOSPITAL_BASED_OUTPATIENT_CLINIC_OR_DEPARTMENT_OTHER): Payer: Self-pay

## 2022-06-09 ENCOUNTER — Telehealth: Payer: Self-pay | Admitting: Family

## 2022-06-09 ENCOUNTER — Encounter: Payer: Self-pay | Admitting: Family

## 2022-06-09 DIAGNOSIS — G4452 New daily persistent headache (NDPH): Secondary | ICD-10-CM

## 2022-06-09 NOTE — Telephone Encounter (Signed)
Pt aware and nothing found at the eye doctors

## 2022-06-09 NOTE — Telephone Encounter (Signed)
Error

## 2022-06-09 NOTE — Telephone Encounter (Signed)
Gerald Norman (spouse DPR OK) called to request a referral be put in to neurology for the headaches he has been having. Please Advise.

## 2022-06-10 ENCOUNTER — Ambulatory Visit (INDEPENDENT_AMBULATORY_CARE_PROVIDER_SITE_OTHER): Payer: BC Managed Care – PPO | Admitting: Internal Medicine

## 2022-06-10 ENCOUNTER — Encounter: Payer: Self-pay | Admitting: Internal Medicine

## 2022-06-10 VITALS — BP 118/68 | HR 80 | Temp 98.8°F | Resp 18 | Ht 71.0 in | Wt 183.2 lb

## 2022-06-10 DIAGNOSIS — Z8781 Personal history of (healed) traumatic fracture: Secondary | ICD-10-CM | POA: Insufficient documentation

## 2022-06-10 DIAGNOSIS — S066XAA Traumatic subarachnoid hemorrhage with loss of consciousness status unknown, initial encounter: Secondary | ICD-10-CM | POA: Insufficient documentation

## 2022-06-10 DIAGNOSIS — R519 Headache, unspecified: Secondary | ICD-10-CM | POA: Diagnosis not present

## 2022-06-10 DIAGNOSIS — Z8679 Personal history of other diseases of the circulatory system: Secondary | ICD-10-CM | POA: Insufficient documentation

## 2022-06-10 DIAGNOSIS — Z87820 Personal history of traumatic brain injury: Secondary | ICD-10-CM | POA: Insufficient documentation

## 2022-06-10 NOTE — Progress Notes (Unsigned)
Subjective:    Patient ID: Gerald Norman, male    DOB: June 28, 1980, 42 y.o.   MRN: [id number]  DOS:  06/10/2022 Type of visit - description: Acute  Onset of severe and  sudden headache 06/06/2022 Headache described as squeezing, throbbing, located at the front of the head and the sides. Went to the ER: CT angio head: Normal.  No aneurysm noted. BMP okay, CBC showed a white count of 19.3. Upon arrival BP was 168/107. She was seen in the standard ER treatment for headaches including Compazine, Benadryl, IV fluids, ketorolac and Decadron. Symptoms improved. Started amlodipine for hypertension. Subsequently was seen at this office 06/08/2022, repeated CBC showed still elevated white count and he had a persisting headache. Question of L exophthalmos. Patient reports he saw a eye doctor this week and exam was okay.  He is here again because the headache is persisting, steady, minimal improvement with ibuprofen or Tylenol. Unable to sleep well due to the headache. I noted a white count, he denies any fever or chills. He does not recall any specific tick bite but last month he had a blister rash on the left neck and left hand that is now resolved.  He volunteered the information of some neck pain. He also reports some light intolerance.  Denies nausea vomiting except the first days of headache. Denies generalized arthralgias or respiratory symptoms.  Review of Systems See above   Past Medical History:  Diagnosis Date   Allergy    Closed head injury 2002   severe closed head injury--fell off balcony landing on head (Dr Towanda Malkin)  Rt supraorbital fx, rt elbow fx, rt maxillary fx, subdural hematoma, subarachnoid hemorrhage   Fatty liver 06/27/2012    Past Surgical History:  Procedure Laterality Date   EYE SURGERY  2002   Rt. eye socket repair due to accident--had a plate in place    Current Outpatient Medications  Medication Instructions   amLODipine (NORVASC) 5 mg, Oral, Daily    fluticasone (FLONASE) 50 MCG/ACT nasal spray PLACE 2 SPRAYS INTO THE NOSE DAILY.   loratadine (CLARITIN) 10 mg, Oral, Daily   Multiple Vitamin (MULTIVITAMIN) tablet 1 tablet, Oral, Daily,         Objective:   Physical Exam BP 118/68   Pulse 80   Temp 98.8 F (37.1 C) (Oral)   Resp 18   Ht 5\' 11"  (1.803 m)   Wt 183 lb 4 oz (83.1 kg)   SpO2 97%   BMI 25.56 kg/m  General:   Well developed, NAD, BMI noted. HEENT:  Normocephalic . Face symmetric, atraumatic. EOMI, no photophobia or conjunctival injection noted. Exophthalmos?  Questionable on the left. Neck: Supple, the patient did report some discomfort with flexion of the neck. No thyromegaly Lungs:  CTA B Normal respiratory effort, no intercostal retractions, no accessory muscle use. Heart: RRR,  no murmur.  Lower extremities: no pretibial edema bilaterally  Skin: Not pale. Not jaundice Neurologic:  alert & oriented X3.  Speech normal, gait appropriate for age and unassisted.  Motor symmetric. Psych--  Cognition and judgment appear intact.  Cooperative with normal attention span and concentration.  Behavior appropriate. No anxious or depressed appearing.      Assessment     42 year old male, PMH includes hypertension, allergies, anxiety, GERD, presents with  Headache:  New onset 06/06/2022, associated with an elevated white count but no fever.  Temperature today 98.8. Also reported some neck pain, no objective stiffness.  No objective photophobia. Questionable exophthalmos.  Saw a ophthalmologist. I reach out to neurology and the sooner appointment I could get is next Tuesday, February 13 at 11 AM.  Patient aware. Plan: Repeat WBC, sed rate, Lyme RMSF Ehrlichia  serology, ER if persistently abnormal labs or  persistent symptoms, will need neurology evaluation.

## 2022-06-10 NOTE — Telephone Encounter (Signed)
Pt stated he was told to give [place] the name of his previous eye dr. Hadley Pen at [facility].

## 2022-06-10 NOTE — Patient Instructions (Addendum)
You have an appointment to see neurology next Tuesday at 11 AM. [address], [place], [place] [address] Phone: [phone]  Blood work today  ER if: Your blood work is abnormal, we will let you know Severe and persisting headache, rash, fever chills, problems with your eyes.

## 2022-06-12 ENCOUNTER — Other Ambulatory Visit: Payer: BC Managed Care – PPO

## 2022-06-13 LAB — B. BURGDORFI ANTIBODIES BY WB

## 2022-06-13 LAB — CBC WITH DIFFERENTIAL/PLATELET
Absolute Monocytes: 717 cells/uL (ref 200–950)
Basophils Absolute: 102 cells/uL (ref 0–200)
Basophils Relative: 0.8 %
Eosinophils Absolute: 371 cells/uL (ref 15–500)
Eosinophils Relative: 2.9 %
HCT: 45.4 % (ref 38.5–50.0)
Hemoglobin: 15.6 g/dL (ref 13.2–17.1)
Lymphs Abs: 3840 cells/uL (ref 850–3900)
MCH: 30.6 pg (ref 27.0–33.0)
MCHC: 34.4 g/dL (ref 32.0–36.0)
MCV: 89.2 fL (ref 80.0–100.0)
MPV: 11.3 fL (ref 7.5–12.5)
Monocytes Relative: 5.6 %
Neutro Abs: 7770 cells/uL (ref 1500–7800)
Neutrophils Relative %: 60.7 %
Platelets: 300 10*3/uL (ref 140–400)
RBC: 5.09 10*6/uL (ref 4.20–5.80)
RDW: 12.5 % (ref 11.0–15.0)
Total Lymphocyte: 30 %
WBC: 12.8 10*3/uL — ABNORMAL HIGH (ref 3.8–10.8)

## 2022-06-13 LAB — ROCKY MTN SPOTTED FVR ABS PNL(IGG+IGM)
RMSF IgG: NOT DETECTED
RMSF IgM: NOT DETECTED

## 2022-06-13 LAB — EHRLICHIA ANTIBODY PANEL
E. CHAFFEENSIS AB IGG: <1:64 {titer}
E. CHAFFEENSIS AB IGM: <1:20 {titer}

## 2022-06-13 LAB — LYME AB SCREEN RFLX: Lyme AB Screen: 1 index — ABNORMAL HIGH

## 2022-06-13 LAB — SEDIMENTATION RATE: Sed Rate: 17 mm/h — ABNORMAL HIGH (ref 0–15)

## 2022-06-16 ENCOUNTER — Ambulatory Visit: Payer: BC Managed Care – PPO | Admitting: Neurology

## 2022-07-30 ENCOUNTER — Ambulatory Visit: Payer: BC Managed Care – PPO | Admitting: Neurology

## 2022-08-03 ENCOUNTER — Telehealth: Payer: Self-pay | Admitting: Family

## 2022-08-03 NOTE — Telephone Encounter (Signed)
Patient states he called his pharmacy to get a refill on his claritin and flonase medication but his pharmacy is giving him trouble getting it refilled. He would like a call back to discuss. Please advise.

## 2022-08-04 ENCOUNTER — Other Ambulatory Visit: Payer: Self-pay

## 2022-08-04 DIAGNOSIS — J301 Allergic rhinitis due to pollen: Secondary | ICD-10-CM

## 2022-08-04 MED ORDER — LORATADINE 10 MG PO TABS: 10.0000 mg | ORAL_TABLET | Freq: Every day | ORAL | 1 refills | Status: AC

## 2022-08-04 NOTE — Telephone Encounter (Signed)
Patient not able to get more than one bottle at pharmacy OTC, rx sent for 90 tabs for seasonal allergic rhinitis. He will continue to get the flonase otc

## 2023-10-16 NOTE — Patient Instructions (Signed)

## 2023-10-20 ENCOUNTER — Encounter: Payer: Self-pay | Admitting: Nurse Practitioner

## 2023-10-20 ENCOUNTER — Ambulatory Visit (INDEPENDENT_AMBULATORY_CARE_PROVIDER_SITE_OTHER): Payer: Self-pay | Admitting: Nurse Practitioner

## 2023-10-20 VITALS — BP 117/76 | HR 66 | Temp 97.8°F | Resp 18 | Ht 69.8 in | Wt 182.8 lb

## 2023-10-20 DIAGNOSIS — Z0000 Encounter for general adult medical examination without abnormal findings: Secondary | ICD-10-CM | POA: Insufficient documentation

## 2023-10-20 DIAGNOSIS — Z0289 Encounter for other administrative examinations: Secondary | ICD-10-CM

## 2023-10-20 NOTE — Progress Notes (Signed)
 BP 117/76 (BP Location: Left Arm, Patient Position: Sitting, Cuff Size: Normal)   Pulse 66   Temp 97.8 F (36.6 C) (Oral)   Resp 18   Ht 5' 9.8 (1.773 m)   Wt 182 lb 12.8 oz (82.9 kg)   SpO2 97%   BMI 26.38 kg/m    Subjective:    Patient ID: Gerald Norman, male    DOB: October 06, 1980, 43 y.o.   MRN: [id number]  HPI: Gerald Norman is a 43 y.o. male presenting on 10/20/2023 for DOT Physical.  This is initial DOT physical.  Currently patient plans to drive in state mostly. History of subarachnoid hemorrhage in 2002, no issues since that time. Only takes allergy medications as needed.  Past Medical History:  Past Medical History:  Diagnosis Date   Allergy    Closed head injury 2002   severe closed head injury--fell off balcony landing on head (Dr Lindle Rhea)  Rt supraorbital fx, rt elbow fx, rt maxillary fx, subdural hematoma, subarachnoid hemorrhage   Fatty liver 06/27/2012    Surgical History:  Past Surgical History:  Procedure Laterality Date   EYE SURGERY  2002   Rt. eye socket repair due to accident--had a plate in place    Medications:  Current Outpatient Medications on File Prior to Visit  Medication Sig   fluticasone  (FLONASE ) 50 MCG/ACT nasal spray PLACE 2 SPRAYS INTO THE NOSE DAILY.   loratadine  (CLARITIN ) 10 MG tablet Take 1 tablet (10 mg total) by mouth daily.   Multiple Vitamin (MULTIVITAMIN) tablet Take 1 tablet by mouth daily.   No current facility-administered medications on file prior to visit.    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, learning disability)   Married, has foster children sometimes   2 children age 27 and 2 (biological)   No pets at home   Social Drivers of Corporate investment banker Strain: Not on file  Food Insecurity: Not on file  Transportation Needs: Not on file  Physical Activity: Not on file  Stress: Not on file  Social Connections: Not on file  Intimate Partner Violence: Not on file   Social History   Tobacco Use  Smoking Status Former  Smokeless Tobacco Never   Social History   Substance and Sexual Activity  Alcohol Use Yes   Comment: 2-3 times a week    Family History:  Family History  Problem Relation Age of Onset   Diabetes Other    Hypertension Other    Diabetes Mother    Dementia Father    Hypertension Father    Hypertension Maternal Grandmother    COPD Maternal Grandmother    Cancer Maternal Grandfather    Dementia Paternal Grandmother    Cancer Paternal Grandfather     Past medical history, surgical history,  medications, allergies, family history and social history reviewed with patient today and changes made to appropriate areas of the chart.   ROS All other ROS negative except what is listed above and in the HPI.      Objective:    BP 117/76 (BP Location: Left Arm, Patient Position: Sitting, Cuff Size: Normal)   Pulse 66   Temp 97.8 F (36.6 C) (Oral)   Resp 18   Ht 5' 9.8 (1.773 m)   Wt 182 lb 12.8 oz (82.9 kg)   SpO2 97%   BMI 26.38 kg/m   Wt Readings from Last 3 Encounters:  10/20/23 182 lb 12.8 oz (82.9 kg)  06/10/22 183 lb 4 oz (83.1 kg)  06/08/22 183 lb (83 kg)    Physical Exam Vitals and nursing note reviewed.  Constitutional:      General: He is awake. He is not in acute distress.    Appearance: He is well-developed and well-groomed. He is not ill-appearing or toxic-appearing.  HENT:     Head: Normocephalic  and atraumatic.     Right Ear: Hearing, tympanic membrane, ear canal and external ear normal. No drainage.     Left Ear: Hearing, tympanic membrane, ear canal and external ear normal. No drainage.     Nose: Nose normal.     Mouth/Throat:     Pharynx: Uvula midline.   Eyes:     General: Lids are normal.        Right eye: No discharge.        Left eye: No discharge.     Extraocular Movements: Extraocular movements intact.     Conjunctiva/sclera: Conjunctivae normal.     Pupils: Pupils are equal, round, and reactive to light.     Visual Fields: Right eye visual fields normal and left eye visual fields normal.     Comments: 70 degrees both sides.  Neck:     Thyroid: No thyromegaly.     Vascular: No carotid bruit or JVD.     Trachea: Trachea normal.   Cardiovascular:     Rate and Rhythm: Normal rate and regular rhythm.     Heart sounds: Normal heart sounds, S1 normal and S2 normal. No murmur heard.    No gallop.  Pulmonary:     Effort: Pulmonary effort is normal. No accessory muscle usage or respiratory distress.     Breath sounds: Normal breath sounds.  Abdominal:     General: Bowel sounds are normal.     Palpations: Abdomen is soft. There is no hepatomegaly or splenomegaly.     Tenderness: There is no abdominal tenderness.   Musculoskeletal:        General: Normal range of motion.     Cervical back: Normal range of motion and neck supple.     Right lower leg: No edema.     Left lower leg: No edema.  Lymphadenopathy:     Head:     Right side of head: No submental, submandibular, tonsillar, preauricular or posterior auricular adenopathy.     Left side of head: No submental, submandibular, tonsillar, preauricular or posterior auricular adenopathy.     Cervical: No cervical adenopathy.   Skin:    General: Skin is warm and dry.     Capillary Refill: Capillary refill takes less than 2 seconds.     Findings: No rash.   Neurological:     Mental Status: He is alert and oriented  to person, place, and time.     Cranial Nerves:  Cranial nerves 2-12 are intact.     Sensory: Sensation is intact.     Motor: Motor function is intact.     Coordination: Coordination is intact.     Gait: Gait is intact.     Deep Tendon Reflexes: Reflexes are normal and symmetric.     Reflex Scores:      Brachioradialis reflexes are 2+ on the right side and 2+ on the left side.      Patellar reflexes are 2+ on the right side and 2+ on the left side.  Psychiatric:        Attention and Perception: Attention normal.        Mood and Affect: Mood normal.        Speech: Speech normal.        Behavior: Behavior normal. Behavior is cooperative.        Thought Content: Thought content normal.        Cognition and Memory: Cognition normal.    Hearing Screening   500Hz  1000Hz  2000Hz  4000Hz   Right ear 20 20 20 20   Left ear 20 20 20 20    Vision Screening   Right eye Left eye Both eyes  Without correction 20/20 20/20 20/20   With correction       Results for orders placed or performed in visit on 06-12-2022  Rocky mtn spotted fvr abs pnl(IgG+IgM)   Collection Time: Jun 12, 2022  3:24 PM  Result Value Ref Range   RMSF IgG Not Detected Not Detected   RMSF IgM Not Detected Not Detected  Ehrlichia antibody panel   Collection Time: 06/12/22  3:24 PM  Result Value Ref Range   E. CHAFFEENSIS AB IGG <1:64    E. CHAFFEENSIS AB IGM <1:20    INTERPRETATION    CBC w/Diff   Collection Time: 2022-06-12  3:24 PM  Result Value Ref Range   WBC 12.8 (H) 3.8 - 10.8 Thousand/uL   RBC 5.09 4.20 - 5.80 Million/uL   Hemoglobin 15.6 13.2 - 17.1 g/dL   HCT 40.9 81.1 - 91.4 %   MCV 89.2 80.0 - 100.0 fL   MCH 30.6 27.0 - 33.0 pg   MCHC 34.4 32.0 - 36.0 g/dL   RDW 78.2 95.6 - 21.3 %   Platelets 300 140 - 400 Thousand/uL   MPV 11.3 7.5 - 12.5 fL   Neutro Abs 7,770 1,500 - 7,800 cells/uL   Lymphs Abs 3,840 850 - 3,900 cells/uL   Absolute Monocytes 717 200 - 950 cells/uL   Eosinophils Absolute 371 15 - 500  cells/uL   Basophils Absolute 102 0 - 200 cells/uL   Neutrophils Relative % 60.7 %   Total Lymphocyte 30.0 %   Monocytes Relative 5.6 %   Eosinophils Relative 2.9 %   Basophils Relative 0.8 %  Sedimentation rate   Collection Time: 06/12/22  3:24 PM  Result Value Ref Range   Sed Rate 17 (H) 0 - 15 mm/h  LYME AB SCREEN RFLX   Collection Time: 2022/06/12  3:24 PM  Result Value Ref Range   Lyme AB Screen 1.00 (H) index  B. burgdorfi antibodies by WB   Collection Time: 06/12/22  3:24 PM  Result Value Ref Range   B burgdorferi IgG Abs (IB) NEGATIVE NEGATIVE   Lyme Disease 18 kD IgG NON-REACTIVE    Lyme Disease 23 kD IgG NON-REACTIVE    Lyme Disease 28 kD IgG NON-REACTIVE    Lyme Disease 30 kD IgG NON-REACTIVE    Lyme Disease 39 kD IgG  NON-REACTIVE    Lyme Disease 41 kD IgG REACTIVE (A)    Lyme Disease 45 kD IgG NON-REACTIVE    Lyme Disease 58 kD IgG NON-REACTIVE    Lyme Disease 66 kD IgG NON-REACTIVE    Lyme Disease 93 kD IgG NON-REACTIVE    B burgdorferi IgM Abs (IB) NEGATIVE NEGATIVE   Lyme Disease 23 kD IgM NON-REACTIVE    Lyme Disease 39 kD IgM NON-REACTIVE    Lyme Disease 41 kD IgM NON-REACTIVE       Assessment & Plan:   Problem List Items Addressed This Visit       Other   Encounter for examination required by Department of Transportation (DOT) - Primary   DOT Certificate provided x 2 years, overall healthy male with no current chronic issues.   Hearing test: Pass at 20' Vision: 20/20 R, 20/20 L, 20/20 Both uncorrected Urine 1.015, Neg Protein, Neg Glucose, Neg Hematuria Placed into Federal System and patient will take to [facility].           Follow up plan: Return if symptoms worsen or fail to improve.   PATIENT COUNSELING:    Advised to avoid cigarette smoking.  I discussed with the patient that most people either abstain from alcohol or drink within safe limits (<=14/week and <=4 drinks/occasion for males, <=7/weeks and <= 3 drinks/occasion for females) and  that the risk for alcohol disorders and other health effects rises proportionally with the number of drinks per week and how often a drinker exceeds daily limits.  Discussed cessation/primary prevention of drug use and availability of treatment for abuse.   Diet: Encouraged to adjust caloric intake to maintain  or achieve ideal body weight, to reduce intake of dietary saturated fat and total fat, to limit sodium intake by avoiding high sodium foods and not adding table salt, and to maintain adequate dietary potassium and calcium preferably from fresh fruits, vegetables, and low-fat dairy products.    Stressed the importance of regular exercise  Injury prevention: Discussed safety belts, safety helmets, smoke detector, smoking near bedding or upholstery.   Dental health: Discussed importance of regular tooth brushing, flossing, and dental visits.    NEXT PREVENTATIVE PHYSICAL DUE IN 1 YEAR. Return if symptoms worsen or fail to improve.

## 2023-10-20 NOTE — Assessment & Plan Note (Signed)
 DOT Certificate provided x 2 years, overall healthy male with no current chronic issues.   Hearing test: Pass at 20' Vision: 20/20 R, 20/20 L, 20/20 Both uncorrected Urine 1.015, Neg Protein, Neg Glucose, Neg Hematuria Placed into Federal System and patient will take to [facility].

## 2023-11-09 ENCOUNTER — Encounter: Admitting: Family

## 2024-01-06 ENCOUNTER — Telehealth: Payer: Self-pay | Admitting: Family

## 2024-01-06 NOTE — Telephone Encounter (Signed)
 Spoke with patient and informed him that his DOT physical was submitted on the date of the exam.  Advised patient to take a copy of his DOT card and physical form to the [employer] for verification. Patient acknowledged understanding.

## 2024-01-06 NOTE — Telephone Encounter (Signed)
 Pt came in needing a couple of medical records signed. Placed papers in pcps box. Please call pt when papers are ready to be picked up.

## 2024-01-06 NOTE — Telephone Encounter (Unsigned)
 Copied from CRM [id number]. Topic: General - Other >> Jan 06, 2024 11:06 AM Antwanette L wrote: Reason for CRM: The patient had a dot physical on 10/20/23 with Jolene Cannady. The  patient medical card has an expiration date of 10/19/25 but the [facility] system is not showing an expiration date. The DMV is requesting another fax with the expiration date.

## 2024-01-18 NOTE — Telephone Encounter (Signed)
 Forms put in provider's folder for review

## 2024-03-14 ENCOUNTER — Encounter: Payer: Self-pay | Admitting: Family

## 2024-03-14 ENCOUNTER — Ambulatory Visit (INDEPENDENT_AMBULATORY_CARE_PROVIDER_SITE_OTHER): Admitting: Family

## 2024-03-14 VITALS — BP 115/80 | HR 64 | Temp 98.7°F | Resp 16 | Ht 71.0 in | Wt 186.2 lb

## 2024-03-14 DIAGNOSIS — Z0000 Encounter for general adult medical examination without abnormal findings: Secondary | ICD-10-CM | POA: Diagnosis not present

## 2024-03-14 DIAGNOSIS — R739 Hyperglycemia, unspecified: Secondary | ICD-10-CM

## 2024-03-14 DIAGNOSIS — Z23 Encounter for immunization: Secondary | ICD-10-CM

## 2024-03-14 DIAGNOSIS — Z862 Personal history of diseases of the blood and blood-forming organs and certain disorders involving the immune mechanism: Secondary | ICD-10-CM

## 2024-03-14 DIAGNOSIS — E785 Hyperlipidemia, unspecified: Secondary | ICD-10-CM | POA: Diagnosis not present

## 2024-03-14 NOTE — Progress Notes (Signed)
 Established Patient Office Visit  Patient ID: LAYN KYE, male    DOB: [birth year]  Age: 43 y.o. MRN: [id number] PCP: Daryl Setter, NP  Chief Complaint  Patient presents with   Annual Exam    Patient is here for his annual physical    Subjective:     HPI  Discussed the use of AI scribe software for clinical note transcription with the patient, who gave verbal consent to proceed.  History of Present Illness Patient presents today for complete physical.  Immunizations: gardisil and heplisav B due today Diet: healthy Exercise: daily calisthenics Colonoscopy: will begin at 45 Vision: up to date Dental: up to date      Review of Systems  Constitutional:  Negative for weight loss.  HENT:  Negative for congestion and hearing loss.   Eyes:  Negative for blurred vision.  Respiratory:  Negative for cough.   Cardiovascular:  Negative for leg swelling.  Gastrointestinal:  Negative for constipation and diarrhea.  Genitourinary:  Negative for dysuria and frequency.  Musculoskeletal:  Negative for joint pain and myalgias.  Skin:  Negative for rash.  Neurological:  Negative for headaches.  Psychiatric/Behavioral:  Negative for depression. The patient is not nervous/anxious.       Objective:     BP 115/80 (BP Location: Right Arm, Patient Position: Sitting, Cuff Size: Large)   Pulse 64   Temp 98.7 F (37.1 C) (Oral)   Resp 16   Ht 5' 11 (1.803 m)   Wt 186 lb 3.2 oz (84.5 kg)   SpO2 100%   BMI 25.97 kg/m    Physical Exam   No results found for any visits on 03/14/24.    The 10-year ASCVD risk score (Arnett DK, et al., 2019) is: 2.6% Physical Exam  Constitutional: He is oriented to person, place, and time. He appears well-developed and well-nourished. No distress.  HENT:  Head: Normocephalic and atraumatic.  Right Ear: Tympanic membrane and ear canal normal.  Left Ear: Tympanic membrane and ear canal normal.  Mouth/Throat: Oropharynx is clear and  moist.  Eyes: Pupils are equal, round, and reactive to light. No scleral icterus.  Neck: Normal range of motion. No thyromegaly present.  Cardiovascular: Normal rate and regular rhythm.   No murmur heard. Pulmonary/Chest: Effort normal and breath sounds normal. No respiratory distress. He has no wheezes. He has no rales. He exhibits no tenderness.  Abdominal: Soft. Bowel sounds are normal. He exhibits no distension and no mass. There is no tenderness. There is no rebound and no guarding.  Musculoskeletal: He exhibits no edema.  Lymphadenopathy:    He has no cervical adenopathy.  Neurological: He is alert and oriented to person, place, and time. He has normal patellar reflexes. He exhibits normal muscle tone. Coordination normal.  Skin: Skin is warm and dry.  Psychiatric: He has a normal mood and affect. His behavior is normal. Judgment and thought content normal.           Assessment & Plan:      Assessment & Plan:   Problem List Items Addressed This Visit       Unprioritized   Preventative health care - Primary   Continue work on healthy diet and exercise.  Heplisav B #1 and Gardisil #1 today.        Hyperglycemia   Relevant Orders   Comp Met (CMET)   HgB A1c   Other Visit Diagnoses       History of leukocytosis  Relevant Orders   CBC w/Diff     Hyperlipidemia, unspecified hyperlipidemia type       Relevant Orders   Lipid panel     Need for hepatitis B vaccination       Relevant Orders   Heplisav-B (HepB-CPG) Vaccine (Completed)     Need for HPV vaccine       Relevant Orders   HPV 9-valent vaccine,Recombinat (Completed)       Assessment and Plan Assessment & Plan     Return in about 8 weeks (around 05/09/2024) for Heplisav B #2 and Gardisil #2.    Eleanor GORMAN Ponto, NP [facility]

## 2024-03-14 NOTE — Patient Instructions (Signed)
 Gerald Norman

## 2024-03-14 NOTE — Assessment & Plan Note (Signed)
 Continue work on [employer] and exercise.  Heplisav B #1 and Gardisil #1 today.

## 2024-03-15 ENCOUNTER — Ambulatory Visit: Payer: Self-pay | Admitting: Family

## 2024-03-15 DIAGNOSIS — E778 Other disorders of glycoprotein metabolism: Secondary | ICD-10-CM

## 2024-03-15 LAB — CBC WITH DIFFERENTIAL/PLATELET
Basophils Absolute: 0.1 K/uL (ref 0.0–0.1)
Basophils Relative: 0.5 % (ref 0.0–3.0)
Eosinophils Absolute: 0.4 K/uL (ref 0.0–0.7)
Eosinophils Relative: 3.8 % (ref 0.0–5.0)
HCT: 39.6 % (ref 39.0–52.0)
Hemoglobin: 13.4 g/dL (ref 13.0–17.0)
Lymphocytes Relative: 20.5 % (ref 12.0–46.0)
Lymphs Abs: 2.1 K/uL (ref 0.7–4.0)
MCHC: 33.7 g/dL (ref 30.0–36.0)
MCV: 90.9 fl (ref 78.0–100.0)
Monocytes Absolute: 0.6 K/uL (ref 0.1–1.0)
Monocytes Relative: 5.5 % (ref 3.0–12.0)
Neutro Abs: 7.1 K/uL (ref 1.4–7.7)
Neutrophils Relative %: 69.7 % (ref 43.0–77.0)
Platelets: 236 K/uL (ref 150.0–400.0)
RBC: 4.36 Mil/uL (ref 4.22–5.81)
RDW: 13.8 % (ref 11.5–15.5)
WBC: 10.3 K/uL (ref 4.0–10.5)

## 2024-03-15 LAB — COMPREHENSIVE METABOLIC PANEL WITH GFR
ALT: 20 U/L (ref 0–53)
AST: 22 U/L (ref 0–37)
Albumin: 3.7 g/dL (ref 3.5–5.2)
Alkaline Phosphatase: 44 U/L (ref 39–117)
BUN: 13 mg/dL (ref 6–23)
CO2: 31 meq/L (ref 19–32)
Calcium: 8.8 mg/dL (ref 8.4–10.5)
Chloride: 105 meq/L (ref 96–112)
Creatinine, Ser: 0.95 mg/dL (ref 0.40–1.50)
GFR: 98.38 mL/min (ref >60.00)
Glucose, Bld: 85 mg/dL (ref 70–99)
Potassium: 4.1 meq/L (ref 3.5–5.1)
Sodium: 143 meq/L (ref 135–145)
Total Bilirubin: 0.2 mg/dL (ref 0.2–1.2)
Total Protein: 5.6 g/dL — ABNORMAL LOW (ref 6.0–8.3)

## 2024-03-15 LAB — LIPID PANEL
Cholesterol: 168 mg/dL (ref 0–200)
HDL: 70.8 mg/dL (ref >39.00)
LDL Cholesterol: 62 mg/dL (ref 0–99)
NonHDL: 96.88
Total CHOL/HDL Ratio: 2
Triglycerides: 174 mg/dL — ABNORMAL HIGH (ref 0.0–149.0)
VLDL: 34.8 mg/dL (ref 0.0–40.0)

## 2024-03-15 LAB — HEMOGLOBIN A1C: Hgb A1c MFr Bld: 5.9 % (ref 4.6–6.5)

## 2024-03-15 NOTE — Telephone Encounter (Signed)
 Protein in blood is a little low.  I would recommend that he return to lab for urinalysis to make sure he doesn't have any protein in his urine. Also, work on increasing lean proteins in his diet.

## 2024-03-21 ENCOUNTER — Other Ambulatory Visit

## 2024-03-22 ENCOUNTER — Other Ambulatory Visit

## 2024-03-22 DIAGNOSIS — E778 Other disorders of glycoprotein metabolism: Secondary | ICD-10-CM | POA: Diagnosis not present

## 2024-03-23 ENCOUNTER — Ambulatory Visit: Payer: Self-pay | Admitting: Family

## 2024-03-23 LAB — URINALYSIS, ROUTINE W REFLEX MICROSCOPIC
Bilirubin Urine: NEGATIVE
Hgb urine dipstick: NEGATIVE
Ketones, ur: NEGATIVE
Leukocytes,Ua: NEGATIVE
Nitrite: NEGATIVE
RBC / HPF: NONE SEEN (ref 0–?)
Specific Gravity, Urine: 1.025 (ref 1.000–1.030)
Total Protein, Urine: NEGATIVE
Urine Glucose: NEGATIVE
Urobilinogen, UA: 1 (ref 0.0–1.0)
WBC, UA: NONE SEEN (ref 0–?)
pH: 6.5 (ref 5.0–8.0)
